# Patient Record
Sex: Male | Born: 1955 | Race: White | Hispanic: No | Marital: Married | Smoking: Never smoker
Health system: Southern US, Community
[De-identification: ages and names within clinical notes are randomized; demographics above are authoritative.]

## PROBLEM LIST (undated history)

## (undated) DIAGNOSIS — E785 Hyperlipidemia, unspecified: Secondary | ICD-10-CM

## (undated) DIAGNOSIS — I10 Essential (primary) hypertension: Secondary | ICD-10-CM

## (undated) HISTORY — DX: Hyperlipidemia, unspecified: E78.5

## (undated) HISTORY — PX: TONSILLECTOMY: SUR1361

## (undated) HISTORY — PX: KNEE SURGERY: SHX244

## (undated) HISTORY — DX: Essential (primary) hypertension: I10

## (undated) HISTORY — PX: ACROMIOPLASTY: SHX131

## (undated) HISTORY — PX: VASECTOMY: SHX75

---

## 2004-08-11 ENCOUNTER — Ambulatory Visit: Payer: Self-pay | Admitting: Family Medicine

## 2004-08-19 ENCOUNTER — Ambulatory Visit: Payer: Self-pay | Admitting: Internal Medicine

## 2004-08-25 ENCOUNTER — Ambulatory Visit: Payer: Self-pay | Admitting: Family Medicine

## 2005-03-17 ENCOUNTER — Ambulatory Visit: Payer: Self-pay | Admitting: Internal Medicine

## 2007-09-21 ENCOUNTER — Ambulatory Visit: Payer: Self-pay | Admitting: Family Medicine

## 2007-09-21 ENCOUNTER — Telehealth (INDEPENDENT_AMBULATORY_CARE_PROVIDER_SITE_OTHER): Payer: Self-pay | Admitting: Family Medicine

## 2007-09-21 DIAGNOSIS — E785 Hyperlipidemia, unspecified: Secondary | ICD-10-CM | POA: Insufficient documentation

## 2007-09-21 DIAGNOSIS — I10 Essential (primary) hypertension: Secondary | ICD-10-CM

## 2007-09-27 ENCOUNTER — Encounter: Payer: Self-pay | Admitting: Family Medicine

## 2007-09-27 LAB — CONVERTED CEMR LAB
ALT: 50 units/L (ref 0–53)
AST: 28 units/L (ref 0–37)
Albumin: 4.4 g/dL (ref 3.5–5.2)
Alkaline Phosphatase: 117 units/L (ref 39–117)
BUN: 11 mg/dL (ref 6–23)
CO2: 24 meq/L (ref 19–32)
Calcium: 9.3 mg/dL (ref 8.4–10.5)
Chloride: 103 meq/L (ref 96–112)
Cholesterol: 152 mg/dL (ref 0–200)
Creatinine, Ser: 0.93 mg/dL (ref 0.40–1.50)
Glucose, Bld: 88 mg/dL (ref 70–99)
HDL: 42 mg/dL (ref >39)
LDL Cholesterol: 88 mg/dL (ref 0–99)
PSA: 0.36 ng/mL (ref 0.10–4.00)
Potassium: 3.9 meq/L (ref 3.5–5.3)
Sodium: 139 meq/L (ref 135–145)
Total Bilirubin: 0.5 mg/dL (ref 0.3–1.2)
Total CHOL/HDL Ratio: 3.6
Total Protein: 6.9 g/dL (ref 6.0–8.3)
Triglycerides: 108 mg/dL (ref <150)
VLDL: 22 mg/dL (ref 0–40)

## 2007-11-03 ENCOUNTER — Encounter: Payer: Self-pay | Admitting: Family Medicine

## 2008-09-26 ENCOUNTER — Ambulatory Visit: Payer: Self-pay | Admitting: Family Medicine

## 2008-09-26 LAB — CONVERTED CEMR LAB
ALT: 63 units/L — ABNORMAL HIGH (ref 0–53)
AST: 35 units/L (ref 0–37)
Albumin: 4.2 g/dL (ref 3.5–5.2)
Alkaline Phosphatase: 121 units/L — ABNORMAL HIGH (ref 39–117)
BUN: 12 mg/dL (ref 6–23)
CO2: 24 meq/L (ref 19–32)
Calcium: 9.1 mg/dL (ref 8.4–10.5)
Chloride: 105 meq/L (ref 96–112)
Cholesterol: 165 mg/dL (ref 0–200)
Creatinine, Ser: 0.89 mg/dL (ref 0.40–1.50)
Glucose, Bld: 93 mg/dL (ref 70–99)
HDL: 33 mg/dL — ABNORMAL LOW (ref >39)
LDL Cholesterol: 105 mg/dL — ABNORMAL HIGH (ref 0–99)
PSA: 0.28 ng/mL (ref 0.10–4.00)
Potassium: 3.8 meq/L (ref 3.5–5.3)
Sodium: 140 meq/L (ref 135–145)
Total Bilirubin: 0.6 mg/dL (ref 0.3–1.2)
Total CHOL/HDL Ratio: 5
Total Protein: 7 g/dL (ref 6.0–8.3)
Triglycerides: 137 mg/dL (ref <150)
VLDL: 27 mg/dL (ref 0–40)

## 2008-09-28 ENCOUNTER — Encounter: Payer: Self-pay | Admitting: Family Medicine

## 2008-11-26 ENCOUNTER — Encounter: Payer: Self-pay | Admitting: Family Medicine

## 2009-03-27 ENCOUNTER — Ambulatory Visit: Payer: Self-pay | Admitting: Family Medicine

## 2009-04-03 ENCOUNTER — Telehealth (INDEPENDENT_AMBULATORY_CARE_PROVIDER_SITE_OTHER): Payer: Self-pay | Admitting: *Deleted

## 2009-04-23 ENCOUNTER — Telehealth (INDEPENDENT_AMBULATORY_CARE_PROVIDER_SITE_OTHER): Payer: Self-pay | Admitting: *Deleted

## 2009-05-03 ENCOUNTER — Encounter: Admission: RE | Admit: 2009-05-03 | Discharge: 2009-05-03 | Payer: Self-pay | Admitting: Family Medicine

## 2009-05-06 ENCOUNTER — Telehealth: Payer: Self-pay | Admitting: Family Medicine

## 2009-05-06 DIAGNOSIS — IMO0002 Reserved for concepts with insufficient information to code with codable children: Secondary | ICD-10-CM

## 2009-05-07 ENCOUNTER — Encounter (INDEPENDENT_AMBULATORY_CARE_PROVIDER_SITE_OTHER): Payer: Self-pay | Admitting: *Deleted

## 2009-05-13 ENCOUNTER — Encounter: Payer: Self-pay | Admitting: Family Medicine

## 2009-05-16 ENCOUNTER — Encounter: Payer: Self-pay | Admitting: Family Medicine

## 2009-05-24 ENCOUNTER — Ambulatory Visit (HOSPITAL_BASED_OUTPATIENT_CLINIC_OR_DEPARTMENT_OTHER): Admission: RE | Admit: 2009-05-24 | Discharge: 2009-05-24 | Payer: Self-pay | Admitting: Orthopedic Surgery

## 2009-07-04 ENCOUNTER — Encounter: Payer: Self-pay | Admitting: Family Medicine

## 2009-10-09 ENCOUNTER — Ambulatory Visit: Payer: Self-pay | Admitting: Family Medicine

## 2009-10-09 DIAGNOSIS — R5383 Other fatigue: Secondary | ICD-10-CM | POA: Insufficient documentation

## 2009-10-09 DIAGNOSIS — R6882 Decreased libido: Secondary | ICD-10-CM | POA: Insufficient documentation

## 2009-10-09 LAB — CONVERTED CEMR LAB
ALT: 33 units/L (ref 0–53)
AST: 22 units/L (ref 0–37)
Albumin: 4.3 g/dL (ref 3.5–5.2)
Alkaline Phosphatase: 127 units/L — ABNORMAL HIGH (ref 39–117)
BUN: 11 mg/dL (ref 6–23)
CO2: 23 meq/L (ref 19–32)
Calcium: 9.2 mg/dL (ref 8.4–10.5)
Chloride: 100 meq/L (ref 96–112)
Cholesterol: 156 mg/dL (ref 0–200)
Creatinine, Ser: 0.97 mg/dL (ref 0.40–1.50)
Glucose, Bld: 98 mg/dL (ref 70–99)
HCT: 43.5 % (ref 39.0–52.0)
HDL: 42 mg/dL (ref >39)
Hemoglobin: 14.7 g/dL (ref 13.0–17.0)
LDL Cholesterol: 95 mg/dL (ref 0–99)
MCHC: 33.8 g/dL (ref 30.0–36.0)
MCV: 82.2 fL (ref 78.0–100.0)
PSA: 0.35 ng/mL (ref 0.10–4.00)
Platelets: 311 10*3/uL (ref 150–400)
Potassium: 3.6 meq/L (ref 3.5–5.3)
RBC: 5.29 M/uL (ref 4.22–5.81)
RDW: 13.5 % (ref 11.5–15.5)
Sodium: 138 meq/L (ref 135–145)
TSH: 1.109 microintl units/mL (ref 0.350–4.500)
Testosterone: 295.41 ng/dL — ABNORMAL LOW (ref 350–890)
Total Bilirubin: 0.7 mg/dL (ref 0.3–1.2)
Total CHOL/HDL Ratio: 3.7
Total Protein: 6.6 g/dL (ref 6.0–8.3)
Triglycerides: 94 mg/dL (ref <150)
VLDL: 19 mg/dL (ref 0–40)
WBC: 12.2 10*3/uL — ABNORMAL HIGH (ref 4.0–10.5)

## 2009-10-14 ENCOUNTER — Ambulatory Visit: Payer: Self-pay | Admitting: Family Medicine

## 2009-10-14 DIAGNOSIS — S43429A Sprain of unspecified rotator cuff capsule, initial encounter: Secondary | ICD-10-CM

## 2009-11-11 ENCOUNTER — Telehealth: Payer: Self-pay | Admitting: Family Medicine

## 2009-12-17 ENCOUNTER — Telehealth: Payer: Self-pay | Admitting: Gastroenterology

## 2009-12-24 ENCOUNTER — Telehealth: Payer: Self-pay | Admitting: Family Medicine

## 2010-05-01 ENCOUNTER — Encounter: Payer: Self-pay | Admitting: *Deleted

## 2010-06-23 ENCOUNTER — Ambulatory Visit: Payer: Self-pay

## 2010-06-23 ENCOUNTER — Encounter: Payer: Self-pay | Admitting: Family Medicine

## 2010-06-26 ENCOUNTER — Telehealth: Payer: Self-pay | Admitting: Family Medicine

## 2010-07-03 ENCOUNTER — Telehealth: Payer: Self-pay | Admitting: *Deleted

## 2010-07-04 ENCOUNTER — Encounter
Admission: RE | Admit: 2010-07-04 | Discharge: 2010-07-04 | Payer: Self-pay | Source: Home / Self Care | Attending: Family Medicine | Admitting: Family Medicine

## 2010-07-07 DIAGNOSIS — S43439A Superior glenoid labrum lesion of unspecified shoulder, initial encounter: Secondary | ICD-10-CM

## 2010-07-08 ENCOUNTER — Encounter (INDEPENDENT_AMBULATORY_CARE_PROVIDER_SITE_OTHER): Payer: Self-pay | Admitting: *Deleted

## 2010-08-04 ENCOUNTER — Encounter: Payer: Self-pay | Admitting: Family Medicine

## 2010-08-13 LAB — BASIC METABOLIC PANEL
BUN: 10 mg/dL (ref 6–23)
CO2: 29 mEq/L (ref 19–32)
Calcium: 9.4 mg/dL (ref 8.4–10.5)
Chloride: 102 mEq/L (ref 96–112)
Creatinine, Ser: 0.99 mg/dL (ref 0.4–1.5)
GFR calc Af Amer: >60 mL/min (ref >60)
GFR calc non Af Amer: >60 mL/min (ref >60)
Glucose, Bld: 110 mg/dL — ABNORMAL HIGH (ref 70–99)
Potassium: 4.3 mEq/L (ref 3.5–5.1)
Sodium: 139 mEq/L (ref 135–145)

## 2010-08-15 ENCOUNTER — Ambulatory Visit
Admission: RE | Admit: 2010-08-15 | Discharge: 2010-08-15 | Payer: Self-pay | Source: Home / Self Care | Attending: Orthopedic Surgery | Admitting: Orthopedic Surgery

## 2010-08-17 ENCOUNTER — Encounter: Payer: Self-pay | Admitting: Family Medicine

## 2010-08-18 LAB — POCT HEMOGLOBIN-HEMACUE: Hemoglobin: 15.6 g/dL (ref 13.0–17.0)

## 2010-08-18 NOTE — Op Note (Signed)
NAME:  DONA, KLEMANN               ACCOUNT NO.:  [id number]  MEDICAL RECORD NO.:  [id number]          PATIENT TYPE:  AMB  LOCATION:  DSC                          FACILITY:  [facility]  PHYSICIAN:  Harvie Junior, M.D.   DATE OF BIRTH:  1956/01/08  DATE OF PROCEDURE:  08/15/2010 DATE OF DISCHARGE:                              OPERATIVE REPORT   PREOPERATIVE DIAGNOSIS:  Anterior labral tear with suspected impingement of the acromioclavicular joint arthritis.  POSTOPERATIVE DIAGNOSIS: 1. Anterior labral tear. 2. Loose body, glenohumeral joint. 3. Impingement. 4. Acromioclavicular joint arthritis.  SURGEON:  Harvie Junior, MD  ASSISTANT:  Marshia Ly, PA  PROCEDURES: 1. Debridement of loose body glenohumeral joint with debridement of     anterior labral tear. 2. Arthroscopic subacromial decompression from lateral and posterior     compartment. 3. Arthroscopic distal clavicle resection from an anterior compartment     over 20 mm.  ANESTHESIA:  General.  BRIEF HISTORY:  Mr. Raju is a 55 year old male with long history of having fallen on the shoulder.  He has been having catching and pain in the shoulder since that time.  He had been treated by Dr. Denny Levy for a long period of time with activity modification, injection therapy. Because of the continuing complaints of pain, he was ultimately taken to the operating room for evaluation under anesthesia and arthroscopy. Preoperatively, I did not feel that he had an unstable shoulder just compared to the opposite side and it really did not seem like he was apprehensive, it did seem like he has more pain.  PROCEDURE:  The patient was taken to the operating room.  After adequate anesthesia was obtained with general anesthetic, the patient was placed supine on the operating room table.  I then spent some time examining both shoulders.  He did have a slight anterior and posterior translation on the right side, but this was  completely symmetric to the right side. I could not dislocate him despite significant attempts to try that.  At this point, the patient was placed into the beach-chair position.  All bony prominences were well padded and then the right shoulder was prepped and draped in the usual sterile fashion.  Following this, routine arthroscopic examination did reveal there was an obvious bony loose body right in the anterior aspect of the glenoid.  This was debrided.  At this point, you could see that the glenoid sort of came over and there was a little bit of a deficiency of the glenoid anteriorly, and this was kind of fractured back a little bit; and we were able to probe this hardened area.  As you follow it down around inferiorly, you could see where this was a standard area of the inferior portion of the glenoid, and at that point, we took a probe and spent some time feeling the labrum and the capsule as it comes into the labrum and certainly became convinced that the labrum was attached to this folded back piece, although there was some hypertrophic labrum and synovium in the front area.  So, at this point, we spent a fair amount of  time debriding the inferior rim, debriding the labrum which was caught into the joint.  We went ahead and put the camera in the front and looked into back.  There was a little bit of posterior labral wear, but not bad.  Rotator cuff on the undersurface looked good.  Biceps tendon looked good.  At this point, we went out of the glenohumeral joint and clearly while we were in the glenohumeral joint, we watched the shoulder with rotation to see if there is any tendency towards subluxation and you could see the middle glenohumeral ligament tightened up and felt that it was working as an anterior stop to translation. Once this was completed, attention was turned out of the glenohumeral joint into the subacromial space and anterolateral acromioplasty was performed from  the lateral and posterior compartment.  Distal clavicle resection was performed over 20 mm of the anterior compartment and the subtotal bursectomy was performed in the subacromial space.  At this point, shoulder was thoroughly irrigated and suction dried, and the arthroscopic portals were closed with a bandage.  Sterile compression dressing was applied.  The patient was taken to the recovery room and was noted to be in satisfactory condition.  Estimated blood loss for this procedure was none.     Harvie Junior, M.D.     Ranae Plumber  D:  08/15/2010  T:  08/16/2010  Job:  [id number]  Electronically Signed by Jodi Geralds M.D. on 08/18/2010 11:01:00 AM

## 2010-08-26 NOTE — Assessment & Plan Note (Signed)
Summary: shoulder & knee,df   Vital Signs:  Patient profile:   55 year old male BP sitting:   115 / 79  Vitals Entered By: Lillia Pauls CMA (June 23, 2010 3:27 PM)  History of Present Illness: 1) continued right shoulder problems--(see notes about his fall inprevious visit with me). It hurts sometimes more than others, achiing but also a catching sharp pain with certainmotions. No hand numbness, no weakness, just some actions he cannot do without pain   2) also f/u knee (left0 . had done well after scope until he visited his brother in law--had to climb steep fligt of stairs multiple times a day for several days and his knee swelled and became painful. he iced it and it is better today.  3) Thinks the androgel is making a little difference in energy and libido but does not like the texture of it--wants to try anything else---(not shot though.)  Current Medications (verified): 1)  Triamterene-Hctz 37.5-25 Mg  Tabs (Triamterene-Hctz) .Marland Kitchen.. 1 By Mouth Once Daily 2)  Lipitor 10 Mg  Tabs (Atorvastatin Calcium) .Marland Kitchen.. 1 By Mouth Once Daily 3)  Aspirin 81 Mg  Tbec (Aspirin) .... One By Mouth Every Day 4)  Fluticasone Propionate 50 Mcg/act Susp (Fluticasone Propionate) .... 2 Sprays Each Nostril Qd 5)  Androderm 5 Mg/24hr Pt24 (Testosterone) .... Apply One As Directed Qhs  Allergies: No Known Drug Allergies  Review of Systems  The patient denies anorexia, fever, weight loss, and weight gain.    Physical Exam  Msk:  RIGHT shoulder pain with supraspinatus testing but intact strength. Positive O brien's test. IR/ER full strength. distally neurovascularly intact  Left knee--no tenderness, no swelling. ligamentouslyintact   Impression & Recommendations:  Problem # 1:  ROTATOR CUFF SPRAIN AND STRAIN (ICD-840.4)  Orders: MRI (MRI) As he did not improve with HEP or injection, and with his signs of labrtal tear, Will get MR arthrogram  Problem # 2:  MEDIAL MENISCUS TEAR, LEFT  (ICD-836.0) resolved issue  Problem # 3:  LIBIDO, DECREASED (ICD-799.81) change to patch  Complete Medication List: 1)  Triamterene-hctz 37.5-25 Mg Tabs (Triamterene-hctz) .Marland Kitchen.. 1 by mouth once daily 2)  Lipitor 10 Mg Tabs (Atorvastatin calcium) .Marland Kitchen.. 1 by mouth once daily 3)  Aspirin 81 Mg Tbec (Aspirin) .... One by mouth every day 4)  Fluticasone Propionate 50 Mcg/act Susp (Fluticasone propionate) .... 2 sprays each nostril qd 5)  Androderm 5 Mg/24hr Pt24 (Testosterone) .... Apply one as directed qhs  Patient Instructions: 1)  MR ARTHROGRAM IS SCHD FOR FRI, 12.9.11 AT FRI AT 3:15PM. [facility]; ADDRESS IS [address]; [phone] Prescriptions: ANDRODERM 5 MG/24HR PT24 (TESTOSTERONE) apply one as directed qhs  #30 x 5   Entered by:   Lillia Pauls CMA   Authorized by:   Denny Levy MD   Signed by:   Lillia Pauls CMA on 06/23/2010   Method used:   Telephoned to ...       [facility] [id number] (retail)       [address]       [place], [place]  [address]       Ph: [phone] or [phone]       Fax: [phone]   RxID:   [id number] ANDRODERM 5 MG/24HR PT24 (TESTOSTERONE) apply one as directed qhs  #30 x 12   Entered and Authorized by:   Denny Levy MD   Signed by:   Denny Levy MD on  06/23/2010   Method used:   Printed then faxed to ...       [id number] (retail)       [address]       [place], [place]  [address]       Ph: [phone] or [phone]       Fax: [phone]   RxID:   [id number]    Orders Added: 1)  MRI [MRI] 2)  Est. Patient Level IV [34742]

## 2010-08-26 NOTE — Progress Notes (Signed)
Summary: Rx/new pharmacy  Phone Note Refill Request   Refills Requested: Medication #1:  ANDROGEL 25 MG/2.5GM GEL apply as directed qd. call into [facility]  Initial call taken by: Knox Royalty,  July 03, 2010 8:52 AM    Prescriptions: ANDROGEL 25 MG/2.5GM GEL (TESTOSTERONE) apply as directed qd  #30 x 12   Entered and Authorized by:   Denny Levy MD   Signed by:   Denny Levy MD on 07/04/2010   Method used:   Telephoned to ...       [facility]* (retail)       [facility] [place], [place]  [address]       Ph: [phone]       Fax: [phone]   RxID:   [id number]   DEAR WHITE TEAM can u call this in? Thanks!  Denny Levy MD  July 04, 2010 9:05 AM  this can only have a refill of 5 b/c of it being a controlled substance??? pt notified.Loralee Pacas CMA  July 04, 2010 10:16 AM

## 2010-08-26 NOTE — Progress Notes (Signed)
Summary: phn msg  Phone Note Call from Patient Call back at [phone]   Caller: Patient Summary of Call: Pt calling to let Dr. Jennette Kettle know that he is still having pain in his right shoulder.  Better but still grabbing at times. Initial call taken by: Clydell Hakim,  November 11, 2009 9:40 AM  Follow-up for Phone Call        to pcp Follow-up by: Gladstone Pih,  November 11, 2009 10:38 AM  Additional Follow-up for Phone Call Additional follow up Details #1::        left messg I will call him back later today  Denny Levy MD  November 11, 2009 11:54 AM     Additional Follow-up for Phone Call Additional follow up Details #2::    I spoke w [facility] re shoulder: It is much better (0% of time but has occasional "catching" that is really painful. reviewing mechanism of his injury i think a labral tear is a distinct possiblilty. We discussed options--he wants to hold off for right now. Next step would be MR Arthrogram

## 2010-08-26 NOTE — Assessment & Plan Note (Signed)
Summary: cpe,df   Vital Signs:  Patient profile:   55 year old male Height:      72 inches Weight:      214.3 pounds BMI:     29.17 Temp:     97.6 degrees F oral Pulse rate:   96 / minute BP sitting:   116 / 82  (left arm) Cuff size:   regular  Vitals Entered By: Gladstone Pih (October 09, 2009 8:40 AM)  CC: CPE, C/o right shoulder pain from fall X3 weeks ago Is Patient Diabetic? No Pain Assessment Patient in pain? no        CC:  CPE and C/o right shoulder pain from fall X3 weeks ago.  History of Present Illness: here for cpe Follow up hypertension. Taking medicines regularly with no problems. Not having any any headaches or chest pains. f/u knee pain--had surgery and is doing really well  had a fall last week and jammed his shoulder having trouble lifting it above shoulder height  fatigue more than usual last few months and has lost his sex drive--no erectile dysfunction.   Habits & Providers  Alcohol-Tobacco-Diet     Tobacco Status: never  Current Medications (verified): 1)  Triamterene-Hctz 37.5-25 Mg  Tabs (Triamterene-Hctz) .Marland Kitchen.. 1 By Mouth Once Daily 2)  Lipitor 10 Mg  Tabs (Atorvastatin Calcium) .Marland Kitchen.. 1 By Mouth Once Daily 3)  Aspirin 81 Mg  Tbec (Aspirin) .... One By Mouth Every Day 4)  Fluticasone Propionate 50 Mcg/act Susp (Fluticasone Propionate) .... 2 Sprays Each Nostril Qd  Allergies (verified): No Known Drug Allergies  Review of Systems  The patient denies anorexia, fever, weight loss, weight gain, chest pain, syncope, dyspnea on exertion, peripheral edema, prolonged cough, headaches, hemoptysis, abdominal pain, severe indigestion/heartburn, hematuria, genital sores, and difficulty walking.    Physical Exam  General:  alert, well-developed, and well-nourished.   Eyes:  vision grossly intact, pupils equal, pupils round, and pupils reactive to light.   Ears:  R ear normal and L ear normal.   Mouth:  good dentition and no gingival abnormalities.     Neck:  supple, full ROM, no masses, no thyromegaly, and no JVD.   Breasts:  no gynecomastia.   Lungs:  normal respiratory effort and normal breath sounds.   Heart:  normal rate, regular rhythm, and no murmur.   Abdomen:  soft, non-tender, no distention, and no masses.   Genitalia:  circumcised.   Prostate:  no gland enlargement, no nodules, and no asymmetry.   Msk:  normal ROM except for right shoulder which he cannot ABduct above shoulder level .  Extremities:  no edema Neurologic:  alert & oriented X3, cranial nerves II-XII intact, strength normal in all extremities, gait normal, and DTRs symmetrical and normal.   Skin:  no worrisome lesions on complete skin exam Psych:  Oriented X3, memory intact for recent and remote, normally interactive, good eye contact, not anxious appearing, and not depressed appearing.     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 1 by mouth once daily 2)  Lipitor 10  Mg Tabs (Atorvastatin calcium) .Marland Kitchen.. 1 by mouth once daily 3)  Aspirin 81 Mg Tbec (Aspirin) .... One by mouth every day 4)  Fluticasone Propionate 50 Mcg/act Susp (Fluticasone propionate) .... 2 sprays each nostril qd Lipid-[facility] [phone]) Comp Met-[facility] [phone]) CBC-[facility] ([address]) TSH-[facility] [phone]) PSA-[facility] [phone]) Testosterone-[facility] [phone]) [facility] - Est  40-64 yrs [phone])  Prevention & Chronic Care Immunizations   Influenza vaccine: Not documented    Tetanus booster: 09/21/2007: given   Tetanus booster due:  09/20/2017    Pneumococcal vaccine: Not documented  Colorectal Screening   Hemoccult: Not documented   Hemoccult due: Not Indicated    Colonoscopy: normal  (10/27/2007)   Colonoscopy due: 10/26/2012  Other Screening   PSA: 0.28  (09/26/2008)   PSA ordered.   PSA due due: 09/20/2008   Smoking status: never  (10/09/2009)  Lipids   Total Cholesterol: 165  (09/26/2008)   LDL: 105  (09/26/2008)   LDL Direct: Not documented   HDL: 33  (09/26/2008)   Triglycerides: 137  (09/26/2008)    SGOT (AST): 35  (09/26/2008)   SGPT (ALT): 63  (09/26/2008) CMP ordered    Alkaline phosphatase: 121  (09/26/2008)   Total bilirubin: 0.6  (09/26/2008)    Lipid flowsheet reviewed?: Yes   Progress toward LDL goal: At goal  Hypertension   Last Blood Pressure: 116 / 82  (10/09/2009)   Serum creatinine: 0.89  (09/26/2008)   Serum potassium 3.8  (09/26/2008) CMP ordered     Hypertension flowsheet reviewed?: Yes   Progress toward BP goal: At goal  Self-Management Support :    Hypertension self-management support: Not documented    Lipid self-management support: Not documented    Impression & Recommendations:  Orders: [facility] - Est  40-64 yrs ([address])   Orders: CBC-[facility] (93716) TSH-[facility] [phone]) PSA-[facility] [phone]) Testosterone-[facility] [phone]) [facility] - Est  40-64 yrs ([address])   Complete Medication List: 1)  Triamterene-hctz 37.5-25 Mg Tabs (Triamterene-hctz) .Marland Kitchen.. 1 by mouth once daily 2)  Lipitor 10 Mg Tabs (Atorvastatin calcium) .Marland Kitchen.. 1 by mouth once daily 3)  Aspirin 81 Mg Tbec (Aspirin) .... One by mouth every day 4)  Fluticasone Propionate 50 Mcg/act Susp (Fluticasone propionate) .... 2 sprays each nostril qd  Other Orders: Lipid-[facility] ([id number]) Comp Met-[facility] ([id number])

## 2010-08-26 NOTE — Letter (Signed)
Summary: [employer]  [employer]   Imported By: Marily Memos 06/24/2010 13:32:15  _____________________________________________________________________  External Attachment:    Type:   Image     Comment:   External Document

## 2010-08-26 NOTE — Assessment & Plan Note (Signed)
Summary: 3:00 appt/shoulder pain/per neal/eo   Vital Signs:  Patient profile:   55 year old male Height:      73 inches Weight:      209 pounds BP sitting:   125 / 88  Vitals Entered By: Lillia Pauls CMA (October 14, 2009 3:01 PM)  History of Present Illness: r shoulder pain after fall onto front sideof body with elbow flexed, palm very close to body. Occurred in parking lot 3 1/2 weeks ago. He stumbled over low curb.  immediate shoulder pain, had difficulty driving home taht day. Has gotten some better, but still a lot of pain with any movement above shoulder height, especially reaching overhead. pain is 4-7/10, is keeping him awake at night some. No numbness or tingling in fingers, no hand weakness.  No prior shoulder injury or surgeruy. Right hand dominant.  Allergies (verified): No Known Drug Allergies  Review of Systems       see hpi  Physical Exam  General:  alert and well-developed.   Msk:  R shoulder; AROm with supraspinatus testing reproduces pain. Strength seems intact altho limited by pain.IR / ER intact. Pain with placing had on lumbar area and can barely get either side to center of spine. Lift off test is normal however.  [place]: rotator cuff muscles appear intact. there is some noted fluid in the subacromial bursa. no impingement noted on dynamic testing. Bicep tendon appears normal and there is nossurrounding edema.   Impression & Recommendations:  Problem # 1:  ROTATOR CUFF SPRAIN AND STRAIN (ICD-840.4)  I see no tear in cuff muscles. I could not see the labrum well at all. We decided to inject subacromial bursa, gave him HEP for next two weeks and he will follow up by phone. Therwe is still potential for labral tear given mechanism of injury, so if not improving as expected, needs re-eval.  Orders: [place] LIMITED ([id number])  Complete Medication List: 1)  Triamterene-hctz 37.5-25 Mg Tabs (Triamterene-hctz) .Marland Kitchen.. 1 by mouth once daily 2)  Lipitor 10 Mg Tabs  (Atorvastatin calcium) .Marland Kitchen.. 1 by mouth once daily 3)  Aspirin 81 Mg Tbec (Aspirin) .... One by mouth every day 4)  Fluticasone Propionate 50 Mcg/act Susp (Fluticasone propionate) .... 2 sprays each nostril qd 5)  Androgel 50 Mg/5gm Gel (Testosterone) .... Apply contents of entire packet to arms or shoulders once a day wash hands after Prescriptions: ANDROGEL 50 MG/5GM GEL (TESTOSTERONE) apply contents of entire packet to arms or shoulders once a day wash hands after  #30 x 12   Entered and Authorized by:   Denny Levy MD   Signed by:   Denny Levy MD on 10/14/2009   Method used:   Print then Give to Patient   RxID:   [id number]   Appended Document: 3:00 appt/shoulder pain/per neal/eo    Clinical Lists Changes  Orders: Added new Service order of Joint Aspirate / Injection, Large (20610) - Signed Added new Service order of Kenalog 10 mg inj ([id number]) - Signed       Complete Medication List: 1)  Triamterene-hctz 37.5-25 Mg Tabs (Triamterene-hctz) .Marland Kitchen.. 1 by mouth once daily 2)  Lipitor 10 Mg Tabs (Atorvastatin calcium) .Marland Kitchen.. 1 by mouth once daily 3)  Aspirin 81 Mg Tbec (Aspirin) .... One by mouth every day 4)  Fluticasone Propionate 50 Mcg/act Susp (Fluticasone propionate) .... 2 sprays each nostril qd 5)  Androgel 50 Mg/5gm Gel (Testosterone) .... Apply contents of entire packet to arms or shoulders once a day wash  hands after

## 2010-08-26 NOTE — Progress Notes (Signed)
Summary: fyi  Phone Note Call from Patient   Caller: Patient Summary of Call: can't use the patch b/c it is cost prohibitive - will continue to [place] the Androgel. Initial call taken by: De Nurse,  June 26, 2010 11:24 AM    New/Updated Medications: ANDROGEL 25 MG/2.5GM GEL (TESTOSTERONE) apply as directed qd Prescriptions: ANDROGEL 25 MG/2.5GM GEL (TESTOSTERONE) apply as directed qd  #30 x 12   Entered and Authorized by:   Denny Levy MD   Signed by:   Denny Levy MD on 06/30/2010   Method used:   Historical   RxID:   [id number]

## 2010-08-26 NOTE — Miscellaneous (Signed)
Summary: flu vaccine received  Clinical Lists Changes received notification from [facility]  that patient received flu vaccine 05/01/2010. Theresia Lo RN  May 01, 2010 11:04 AM  Observations: Added new observation of FLU VAX: Historical (05/01/2010 11:03)      Influenza Immunization History:    Influenza # 1:  Historical (05/01/2010)

## 2010-08-26 NOTE — Progress Notes (Signed)
Summary: Schedule Colonoscopy-CHANGED TO [facility]  Phone Note Outgoing Call   Call placed by: Lamona Curl CMA Duncan Dull),  Dec 17, 2009 3:23 PM Call placed to: Patient Summary of Call: Patient is overdue for colonoscopy. His last colonoscopy was in 2000 and showed hemorrhoids. Patient is now due for colonoscopy due to age. I have spoken to patient and he has changed to [facility]. Initial call taken by: Lamona Curl CMA (AAMA),  Dec 17, 2009 3:24 PM

## 2010-08-26 NOTE — Progress Notes (Signed)
Summary: refill  Phone Note Refill Request Call back at Home Phone [phone] Call back at [phone] Message from:  Patient  Refills Requested: Medication #1:  LIPITOR 10 MG  TABS 1 by mouth once daily [facility] [phone]  Initial call taken by: De Nurse,  Dec 24, 2009 8:41 AM    Prescriptions: LIPITOR 10 MG  TABS (ATORVASTATIN CALCIUM) 1 by mouth once daily  #90 x 3   Entered and Authorized by:   Denny Levy MD   Signed by:   Denny Levy MD on 12/24/2009   Method used:   Electronically to        [facility]* (retail)       [facility] [place], [place]  [address]       Ph: [phone]       Fax: [phone]   RxID:   [id number]

## 2010-08-26 NOTE — Miscellaneous (Signed)
  Clinical Lists Changes  Medications: Added new medication of ANDROGEL 50 MG/5GM GEL (TESTOSTERONE) apply contents of entire packet to arms or shoulders once a day wash hands after - Signed Rx of ANDROGEL 50 MG/5GM GEL (TESTOSTERONE) apply contents of entire packet to arms or shoulders once a day wash hands after;  #30 x 12;  Signed;  Entered by: Denny Levy MD;  Authorized by: Denny Levy MD;  Method used: Historical Orders: Added new Test order of CBC-[facility] ([id number]) - Signed Added new Test order of Miscellaneous Lab Charge-[facility] [phone]) - Signed    Prescriptions: ANDROGEL 50 MG/5GM GEL (TESTOSTERONE) apply contents of entire packet to arms or shoulders once a day wash hands after  #30 x 12   Entered and Authorized by:   Denny Levy MD   Signed by:   Denny Levy MD on 10/14/2009   Method used:   Historical   RxID:   [id number]    Complete Medication List: 1)  Triamterene-hctz 37.5-25 Mg Tabs (Triamterene-hctz) .Marland Kitchen.. 1 by mouth once daily 2)  Lipitor 10 Mg Tabs (Atorvastatin calcium) .Marland Kitchen.. 1 by mouth once daily 3)  Aspirin 81 Mg Tbec (Aspirin) .... One by mouth every day 4)  Fluticasone Propionate 50 Mcg/act Susp (Fluticasone propionate) .... 2 sprays each nostril qd 5)  Androgel 50 Mg/5gm Gel (Testosterone) .... Apply contents of entire packet to arms or shoulders once a day wash hands after

## 2010-08-28 NOTE — Consult Note (Signed)
Summary: [facility]  [facility]   Imported By: Knox Royalty 08/21/2010 11:52:31  _____________________________________________________________________  External Attachment:    Type:   Image     Comment:   External Document

## 2010-08-28 NOTE — Miscellaneous (Signed)
Summary: ortho appt for shoulder  appt with dr. Luiz Blare on jan 9th 2012 at 9:30am [address] [place], [place]  [phone]

## 2010-09-04 ENCOUNTER — Encounter: Payer: Self-pay | Admitting: *Deleted

## 2010-10-15 ENCOUNTER — Encounter: Payer: Self-pay | Admitting: Family Medicine

## 2010-10-15 ENCOUNTER — Ambulatory Visit (INDEPENDENT_AMBULATORY_CARE_PROVIDER_SITE_OTHER): Payer: PRIVATE HEALTH INSURANCE | Admitting: Family Medicine

## 2010-10-15 DIAGNOSIS — E291 Testicular hypofunction: Secondary | ICD-10-CM

## 2010-10-15 DIAGNOSIS — Z0000 Encounter for general adult medical examination without abnormal findings: Secondary | ICD-10-CM | POA: Insufficient documentation

## 2010-10-15 DIAGNOSIS — I10 Essential (primary) hypertension: Secondary | ICD-10-CM

## 2010-10-15 DIAGNOSIS — E349 Endocrine disorder, unspecified: Secondary | ICD-10-CM

## 2010-10-15 LAB — LIPID PANEL
Cholesterol: 153 mg/dL (ref 0–200)
HDL: 44 mg/dL (ref >39)
LDL Cholesterol: 92 mg/dL (ref 0–99)
Total CHOL/HDL Ratio: 3.5 Ratio
Triglycerides: 87 mg/dL (ref <150)
VLDL: 17 mg/dL (ref 0–40)

## 2010-10-15 LAB — TESTOSTERONE: Testosterone: 236.53 ng/dL — ABNORMAL LOW (ref 250–890)

## 2010-10-15 MED ORDER — TRIAMTERENE-HCTZ 37.5-25 MG PO TABS: 1.0000 | ORAL_TABLET | Freq: Every day | ORAL | Status: DC

## 2010-10-15 MED ORDER — ATORVASTATIN CALCIUM 10 MG PO TABS: 10.0000 mg | ORAL_TABLET | Freq: Every day | ORAL | Status: DC

## 2010-10-15 NOTE — Progress Notes (Signed)
  Subjective:    Patient ID: Shawn Fitzpatrick, male    DOB: 1956-07-22, 55 y.o.   MRN: [id number]  HPI  Here for well visit Taking BP and chol meds regularly without problem. Has been off his androgel since before his surgery--did notice that it helped so heplans to go backon it. Had his shoulder surgery and is doing much better Review of Systems Complete 14 point ROS is negative for any pertinent positives other than mild right shoulder pain as above    Objective:   Physical Exam  Constitutional: He appears well-developed and well-nourished.  HENT:  Right Ear: External ear normal.  Left Ear: External ear normal.  Eyes: Conjunctivae are normal. Pupils are equal, round, and reactive to light.  Neck: Normal range of motion. Neck supple. No thyromegaly present.  Cardiovascular: Normal rate, regular rhythm and normal heart sounds.   Pulmonary/Chest: Effort normal and breath sounds normal.  Abdominal: Soft. Bowel sounds are normal. He exhibits no mass. There is no tenderness.  Genitourinary: Rectum normal, prostate normal and penis normal.  Musculoskeletal:       FROm all joints except righ shoulder still some pain with overhead extension   Neurological: He is alert. He has normal reflexes.  Skin: Skin is warm and dry.       Complete skin exam negative for worrisome lesions  Psychiatric: He has a normal mood and affect. His behavior is normal. Judgment and thought content normal.          Assessment & Plan:

## 2010-10-15 NOTE — Assessment & Plan Note (Signed)
Labs Encouraged more exercise

## 2010-10-15 NOTE — Patient Instructions (Signed)
Great to see you. Tell Shawn Fitzpatrick to come on back and see me if her foot is not getting better--also sorry to hear about her brother. YOU look wonderful--BP excellent. I will send you a note about your labs. Your Td and colonoscopy are in date. I will plan on seeing you in a year but call sooner if something comes up.

## 2010-10-16 LAB — COMPLETE METABOLIC PANEL WITH GFR
ALT: 46 U/L (ref 0–53)
AST: 25 U/L (ref 0–37)
Albumin: 4.6 g/dL (ref 3.5–5.2)
Alkaline Phosphatase: 128 U/L — ABNORMAL HIGH (ref 39–117)
BUN: 16 mg/dL (ref 6–23)
CO2: 28 mEq/L (ref 19–32)
Calcium: 10.2 mg/dL (ref 8.4–10.5)
Chloride: 100 mEq/L (ref 96–112)
Creat: 0.92 mg/dL (ref 0.40–1.50)
GFR, Est African American: >60 mL/min (ref >60)
GFR, Est Non African American: >60 mL/min (ref >60)
Glucose, Bld: 86 mg/dL (ref 70–99)
Potassium: 3.8 mEq/L (ref 3.5–5.3)
Sodium: 139 mEq/L (ref 135–145)
Total Bilirubin: 1 mg/dL (ref 0.3–1.2)
Total Protein: 7.1 g/dL (ref 6.0–8.3)

## 2010-10-30 LAB — BASIC METABOLIC PANEL
BUN: 7 mg/dL (ref 6–23)
CO2: 29 mEq/L (ref 19–32)
Calcium: 9.3 mg/dL (ref 8.4–10.5)
Chloride: 102 mEq/L (ref 96–112)
Creatinine, Ser: 0.96 mg/dL (ref 0.4–1.5)
GFR calc Af Amer: >60 mL/min (ref >60)
GFR calc non Af Amer: >60 mL/min (ref >60)
Glucose, Bld: 99 mg/dL (ref 70–99)
Potassium: 3.9 mEq/L (ref 3.5–5.1)
Sodium: 140 mEq/L (ref 135–145)

## 2010-10-30 LAB — POCT HEMOGLOBIN-HEMACUE: Hemoglobin: 15.2 g/dL (ref 13.0–17.0)

## 2010-11-10 ENCOUNTER — Telehealth: Payer: Self-pay | Admitting: *Deleted

## 2010-11-10 NOTE — Telephone Encounter (Signed)
Shawn Fitzpatrick from [facility]  calls stating she has received an RX from Dr. Jennette Kettle for maxizide tabs and patient has been getting  Dyazide capsules.  Same ingredients 37.5/25 mg but in capsule instead of tab. Called Dr. Jennette Kettle and she advises ok to give what he has been getting,  the Dyazide.

## 2010-11-20 ENCOUNTER — Telehealth: Payer: Self-pay | Admitting: Family Medicine

## 2010-11-20 NOTE — Telephone Encounter (Signed)
States that he wants to stop using Androgel b/c it makes him depressed and not feel good.  After googling this, he has determined it could be from the gel.  Wants to know if he should cut back on it or just stop using it. Please advise

## 2010-11-24 NOTE — Telephone Encounter (Signed)
Dear [facility] Team Let's see if using it twice a week or three times a week makes a difference. THANKS! Denny Levy

## 2010-11-24 NOTE — Telephone Encounter (Signed)
Told pt what Dr. Jennette Kettle suggested regarding meds and he agreed and if he continues to have anymore issues he will call to inform us.Laureen Ochs, Viann Shove

## 2010-12-16 ENCOUNTER — Encounter: Payer: Self-pay | Admitting: Family Medicine

## 2011-07-31 ENCOUNTER — Ambulatory Visit (HOSPITAL_COMMUNITY)
Admission: RE | Admit: 2011-07-31 | Discharge: 2011-07-31 | Disposition: A | Discharge status: Home / Self Care | Payer: BC Managed Care – PPO | Source: Ambulatory Visit | Attending: Family Medicine | Admitting: Family Medicine

## 2011-07-31 ENCOUNTER — Ambulatory Visit (INDEPENDENT_AMBULATORY_CARE_PROVIDER_SITE_OTHER): Payer: BC Managed Care – PPO | Admitting: Family Medicine

## 2011-07-31 ENCOUNTER — Encounter: Payer: Self-pay | Admitting: Family Medicine

## 2011-07-31 VITALS — BP 163/103 | HR 113 | Temp 98.7°F | Ht 73.0 in | Wt 214.0 lb

## 2011-07-31 DIAGNOSIS — R079 Chest pain, unspecified: Secondary | ICD-10-CM

## 2011-07-31 DIAGNOSIS — K2970 Gastritis, unspecified, without bleeding: Secondary | ICD-10-CM

## 2011-07-31 DIAGNOSIS — R9431 Abnormal electrocardiogram [ECG] [EKG]: Secondary | ICD-10-CM | POA: Insufficient documentation

## 2011-07-31 DIAGNOSIS — K2990 Gastroduodenitis, unspecified, without bleeding: Secondary | ICD-10-CM

## 2011-07-31 MED ORDER — OMEPRAZOLE 20 MG PO CPDR: 20.0000 mg | DELAYED_RELEASE_CAPSULE | Freq: Every day | ORAL | Status: DC

## 2011-07-31 MED ORDER — GI COCKTAIL ~~LOC~~
30.0000 mL | Freq: Once | ORAL | Status: AC
  Administered 2011-07-31: 30 mL via ORAL

## 2011-07-31 NOTE — Progress Notes (Signed)
  Subjective:    Patient ID: Shawn Fitzpatrick, male    DOB: 06-29-1956, 56 y.o.   MRN: [id number]  HPI  1. Shoulder pain. Patient began having epigastric pain radiating to bilateral shoulders two days ago. Accompanied by sensation of needing to belch, but cannot let air out. Initially thought just GI related and has been taking pepcid, not helping. Has been eating large meals over the holidays, but unable to tolerate more than a plain lettuce sandwich past 2 days. He presents for work-in appointment because pepcid bottle told him to present to MD if shoulder or chest pain and now is worried about alternate etiologies. Denies any nausea, emesis, diarrhea, constipation, diaphoresis, chest pain, edema. Does feel like his inferior ribs hurt with deep breathing and his heart beats faster with exertion.  Takes daily aspirin, unsure if enteric coated.  Review of Systems Treated for sinus infection as UC yesterday, otherwise negative. No smoking history or heart disease.    Objective:   Physical Exam  Vitals reviewed. Constitutional: He is oriented to person, place, and time. He appears well-developed and well-nourished. No distress.  HENT:  Head: Normocephalic and atraumatic.  Mouth/Throat: Oropharynx is clear and moist. No oropharyngeal exudate.  Eyes: EOM are normal. Pupils are equal, round, and reactive to light.  Cardiovascular: Normal rate, regular rhythm, normal heart sounds and intact distal pulses.  Exam reveals no gallop.   No murmur heard. Pulmonary/Chest: Effort normal and breath sounds normal. No respiratory distress. He has no wheezes. He has no rales. He exhibits no tenderness.  Abdominal: Soft. Bowel sounds are normal. He exhibits no distension. There is tenderness. There is no rebound and no guarding.       Mildly TTP in epigastric region. No RUQ pain.  Musculoskeletal: He exhibits no edema.  Neurological: He is alert and oriented to person, place, and time. No cranial nerve  deficit. Coordination normal.  Skin: He is not diaphoretic.  Psychiatric:       Mildly anxious    EKG: Sinus tachy Rate 101, biphasic TW in V4,5. No evidence of ischemia or arrhythmia.       Assessment & Plan:

## 2011-07-31 NOTE — Assessment & Plan Note (Addendum)
Abdominal pain improved with GI cocktail in office today. No signs of cardiac etiology on EKG. Advised patient to continue pepcid and start omeprazole for a short course of dual therapy. Maalox prn for short term relief. Change daily aspirin to 81mg  enteric coated. F/u with PCP in 2 weeks for reassessment of pain and BP check noted to be mildly elevated most likely due to pain.

## 2011-07-31 NOTE — Patient Instructions (Signed)
Your EKG looks normal, most likely pain is gastritis/indigestion. Continue taking pepcid up to 40mg  daily. Start omeprazole also. May try mylanta or maalox for quicker symptom relief. Return to Dr Jennette Kettle in next 2 weeks for follow up.  Gastritis Gastritis is an irritation of the stomach. This is often caused by medications, but can be from anything that bothers the stomach. Other stomach irritants are:  Alcohol.   Caffeine.   Nicotine.   Spicy or acid foods.   Medications for pain and arthritis. Aspirin and other anti-inflammatory medicines such as ibuprofen (Advil), naproxen (Aleve), and ketoprofen (Orudis) can be highly irritating.   Emotional distress.  Symptoms of gastritis may include:  Abdominal pain.   Indigestion.   Nausea and or vomiting.   Bleeding.  Some patients with chronic gastritis and ulcers have been infected by a germ. They may need special testing. Medications which kill germs can be used to cure this condition. Treatment includes avoiding the substances mentioned above that are known to cause stomach trouble. Medications used to treat gastritis can include:  Antacids.   Medicines to control vomiting.   Acid blocking medicines.  Symptoms of gastritis usually improve within 2-3 days of starting treatment. Call your caregiver if you are not better in a few days. SEEK MEDICAL CARE IF:   You have increased stomach or chest pain.   You vomit blood.   You faint or feel lightheaded.   You cannot keep fluids down.   You pass bloody or black stools.   You develop severe back pain.  MAKE SURE YOU:   Understand these instructions.   Will watch your condition.   Will get help right away if you are not doing well or get worse.  Document Released: 07/13/2005 Document Revised: 03/25/2011 Document Reviewed: 12/29/2006 [facility] Patient Information 2012 [place], [place].

## 2011-08-06 ENCOUNTER — Telehealth: Payer: Self-pay | Admitting: Family Medicine

## 2011-08-06 NOTE — Telephone Encounter (Signed)
Spoke with patient after cardiology reviewed EKG, question of new TWI. Patient's pain has resolved on PPI and pepcid. Feels "100%" better. No exertional symptoms. Advised to f/u sooner if pain recurs to rule out cardiac source. PATP.

## 2011-08-11 ENCOUNTER — Telehealth: Payer: Self-pay | Admitting: Family Medicine

## 2011-08-11 NOTE — Telephone Encounter (Signed)
Thanks Aubra Pappalardo  

## 2011-08-11 NOTE — Telephone Encounter (Signed)
Message copied by Nestor Ramp on Tue Aug 11, 2011  5:04 PM ------      Message from: Durwin Reges      Created: Tue Aug 04, 2011  1:05 PM      Regarding: EKG read       Hi Dr. Jennette Kettle,      I saw Mr. Radermacher last week for epigastric pain radiating to shoulders, improved with GI cocktail in office. Dr. Perley Jain and I looked at his EKG in office and cleared him, we saw biphasic T waves in lateral leads, but the overread back today says new TW inversions. I advised him to return to your clinic to assess if pain resolved with PPI. Just wanted you to be aware of official EKG read in case his pain sounded more cardiac to you later.

## 2011-09-02 ENCOUNTER — Ambulatory Visit (INDEPENDENT_AMBULATORY_CARE_PROVIDER_SITE_OTHER): Payer: BC Managed Care – PPO | Admitting: Family Medicine

## 2011-09-02 ENCOUNTER — Encounter: Payer: Self-pay | Admitting: Family Medicine

## 2011-09-02 DIAGNOSIS — R079 Chest pain, unspecified: Secondary | ICD-10-CM

## 2011-09-03 ENCOUNTER — Encounter: Payer: Self-pay | Admitting: Family Medicine

## 2011-09-03 MED ORDER — TESTOSTERONE 20.25 MG/ACT (1.62%) TD GEL: 2.0000 | Freq: Every day | TRANSDERMAL | Status: DC

## 2011-09-03 NOTE — Progress Notes (Signed)
  Subjective:    Patient ID: Shawn Fitzpatrick, male    DOB: [birth year], 56 y.o.   MRN: [id number]  HPI  1. Followup episode of chest pain. ( see Dr Ernest Haber note pasted below) About 6 days after starting the medicine for his stomach he noticed complete resolution of the chest pain. Has not had it recur. #2. Has some questions about the new form of AndroGel and whether or not that would be useful for him. He really does not like the preparation that he has right now is he feels gritty and he doesn't want to apply it. He does notice some improvement in libido and in general well being with use.   NOTE REVIEW from initial ov related to chest pain (Dr. Ernest Haber note on 07/31/2011) )Shoulder pain. Patient began having epigastric pain radiating to bilateral shoulders two days ago. Accompanied by sensation of needing to belch, but cannot let air out. Initially thought just GI related and has been taking pepcid, not helping. Has been eating large meals over the holidays, but unable to tolerate more than a plain lettuce sandwich past 2 days. He presents for work-in appointment because pepcid bottle told him to present to MD if shoulder or chest pain and now is worried about alternate etiologies. Denies any nausea, emesis, diarrhea, constipation, diaphoresis, chest pain, edema. Does feel like his inferior ribs hurt with deep breathing and his heart beats faster with exertion.  Takes daily aspirin, unsure if enteric coated.  Review of Systems See. history of present illness above.    Objective:   Physical Exam  Vital signs reviewed. GENERAL: Well developed, well nourished, no acute distress Cardiac : Regular rate and rhythm no murmur gallop or rub ABDOMEN: Soft positive bowel sounds  EKG reviewed: Lateral T wave inversion  Which is new    Assessment & Plan:  #1. Chest pain that seemed relief with proton pump inhibitor. He did have an abnormal EKG though. I do not have available to compare with. After  reviewing distortion today at still think given his family history and risk factors that a cardiology evaluation would be a good idea and he agrees. I will set that up #2. Also think with this episode of but seems most consistent with severe esophagitis with probably I get an EGD done and I will set that up. He saw [facility] 10/27/2007 for colonoscopy #3. Testosterone replacement: Will try the new pump form and she elected any better. Here he has followup with me in a month and a half.

## 2011-10-07 ENCOUNTER — Encounter: Payer: Self-pay | Admitting: Family Medicine

## 2011-10-07 ENCOUNTER — Ambulatory Visit (INDEPENDENT_AMBULATORY_CARE_PROVIDER_SITE_OTHER): Payer: BC Managed Care – PPO | Admitting: Family Medicine

## 2011-10-07 VITALS — BP 117/87 | HR 84 | Temp 98.8°F | Ht 73.0 in | Wt 207.7 lb

## 2011-10-07 DIAGNOSIS — Z79899 Other long term (current) drug therapy: Secondary | ICD-10-CM

## 2011-10-07 DIAGNOSIS — I10 Essential (primary) hypertension: Secondary | ICD-10-CM

## 2011-10-07 DIAGNOSIS — E785 Hyperlipidemia, unspecified: Secondary | ICD-10-CM

## 2011-10-07 DIAGNOSIS — Z0000 Encounter for general adult medical examination without abnormal findings: Secondary | ICD-10-CM

## 2011-10-07 LAB — LIPID PANEL
Cholesterol: 163 mg/dL (ref 0–200)
HDL: 44 mg/dL (ref >39)
LDL Cholesterol: 93 mg/dL (ref 0–99)
Total CHOL/HDL Ratio: 3.7 Ratio
Triglycerides: 128 mg/dL (ref <150)
VLDL: 26 mg/dL (ref 0–40)

## 2011-10-07 LAB — CBC WITH DIFFERENTIAL/PLATELET
Basophils Absolute: 0 10*3/uL (ref 0.0–0.1)
Basophils Relative: 0 % (ref 0–1)
Eosinophils Absolute: 0.1 10*3/uL (ref 0.0–0.7)
Eosinophils Relative: 1 % (ref 0–5)
HCT: 48.4 % (ref 39.0–52.0)
Hemoglobin: 16.1 g/dL (ref 13.0–17.0)
Lymphocytes Relative: 29 % (ref 12–46)
Lymphs Abs: 2 10*3/uL (ref 0.7–4.0)
MCH: 27.8 pg (ref 26.0–34.0)
MCHC: 33.3 g/dL (ref 30.0–36.0)
MCV: 83.6 fL (ref 78.0–100.0)
Monocytes Absolute: 0.9 10*3/uL (ref 0.1–1.0)
Monocytes Relative: 13 % — ABNORMAL HIGH (ref 3–12)
Neutro Abs: 3.8 10*3/uL (ref 1.7–7.7)
Neutrophils Relative %: 56 % (ref 43–77)
Platelets: 336 10*3/uL (ref 150–400)
RBC: 5.79 MIL/uL (ref 4.22–5.81)
RDW: 13.9 % (ref 11.5–15.5)
WBC: 6.8 10*3/uL (ref 4.0–10.5)

## 2011-10-07 LAB — COMPLETE METABOLIC PANEL WITH GFR
ALT: 40 U/L (ref 0–53)
AST: 34 U/L (ref 0–37)
Albumin: 4.5 g/dL (ref 3.5–5.2)
Alkaline Phosphatase: 136 U/L — ABNORMAL HIGH (ref 39–117)
BUN: 9 mg/dL (ref 6–23)
CO2: 27 mEq/L (ref 19–32)
Calcium: 9.9 mg/dL (ref 8.4–10.5)
Chloride: 101 mEq/L (ref 96–112)
Creat: 1.03 mg/dL (ref 0.50–1.35)
GFR, Est African American: >89 mL/min
GFR, Est Non African American: 82 mL/min
Glucose, Bld: 97 mg/dL (ref 70–99)
Potassium: 4 mEq/L (ref 3.5–5.3)
Sodium: 139 mEq/L (ref 135–145)
Total Bilirubin: 0.7 mg/dL (ref 0.3–1.2)
Total Protein: 7.1 g/dL (ref 6.0–8.3)

## 2011-10-07 LAB — PSA: PSA: 0.43 ng/mL (ref <=4.00)

## 2011-10-07 MED ORDER — TESTOSTERONE 25 MG/2.5GM (1%) TD GEL: 1.0000 | Freq: Every day | TRANSDERMAL | Status: DC

## 2011-10-07 MED ORDER — ATORVASTATIN CALCIUM 10 MG PO TABS: 10.0000 mg | ORAL_TABLET | Freq: Every day | ORAL | Status: DC

## 2011-10-07 MED ORDER — TRIAMTERENE-HCTZ 37.5-25 MG PO TABS: 1.0000 | ORAL_TABLET | Freq: Every day | ORAL | Status: DC

## 2011-10-07 MED ORDER — FLUTICASONE PROPIONATE 50 MCG/ACT NA SUSP: 2.0000 | Freq: Every day | NASAL | Status: DC

## 2011-10-07 NOTE — Patient Instructions (Signed)
So far everything looks really very normal. I will send you a letter about your lab results. I will plan on seeing him back in one year unless something comes up. It is great to see you.

## 2011-10-12 ENCOUNTER — Encounter: Payer: Self-pay | Admitting: Family Medicine

## 2011-10-12 NOTE — Progress Notes (Signed)
  Subjective:    Patient ID: Shawn Fitzpatrick, male    DOB: 1956-02-17, 56 y.o.   MRN: [id number]  HPI Here for CPE Having no specific issues at this time Taking meds regulalry for htn and lipids without problem. Is scheduled for EGD 2 weeks to f/u on GERD issues---he has had no more sx on PPI Occasionally exercising Questions about other options for testosterone replacement as he hates the way the androgel feels and the androgel pump is too expensive. Does see some benefit from replacement tehrapy---mostly in better libido but also in improved energy Level   Review of Systems  Constitutional: Negative for activity change, appetite change, fatigue and unexpected weight change.  HENT: Negative for neck stiffness.   Eyes: Negative for pain.  Respiratory: Negative for cough and shortness of breath.   Cardiovascular: Negative for chest pain and palpitations.  Genitourinary: Negative for urgency, frequency, decreased urine volume, discharge and difficulty urinating.  Musculoskeletal: Positive for arthralgias. Negative for myalgias and joint swelling.  Skin: Negative for rash.  Neurological: Negative for dizziness and weakness.  Psychiatric/Behavioral: Negative for confusion, sleep disturbance, dysphoric mood and decreased concentration. The patient is not nervous/anxious.        Objective:   Physical Exam  Constitutional: He is oriented to person, place, and time. He appears well-developed and well-nourished.  HENT:  Head: Normocephalic.  Left Ear: External ear normal.  Nose: Nose normal.  Mouth/Throat: Oropharynx is clear and moist.  Eyes: EOM are normal. Pupils are equal, round, and reactive to light. No scleral icterus.  Neck: Neck supple. No thyromegaly present.  Cardiovascular: Normal rate, regular rhythm, normal heart sounds and intact distal pulses.   Pulmonary/Chest: Effort normal and breath sounds normal. He has no wheezes.  Abdominal: Soft. He exhibits no mass. There is no  tenderness.  Genitourinary: Rectum normal, prostate normal and penis normal.  Musculoskeletal: Normal range of motion.  Lymphadenopathy:    He has no cervical adenopathy.  Neurological: He is alert and oriented to person, place, and time. He has normal reflexes.  Skin: No rash noted.  Psychiatric: He has a normal mood and affect. His behavior is normal. Judgment and thought content normal.    Health Maintenance  Topic Date Due  . Influenza Vaccine  04/26/2012  . Tetanus/tdap  09/20/2017  . Colonoscopy  11/02/2017   Past Medical History  Diagnosis Date  . Hyperlipidemia   . Hypertension    Past Surgical History  Procedure Date  . Acromioplasty   . Knee surgery   . Vasectomy         Assessment & Plan:  cpe discussed options for testosterone replacement--he wants to stay w gel now but will try it qod. Other option would be injectable testosterone cypionate---cheaper---but injection GERD--EGD scheduled Discussed xercise labs

## 2011-10-13 ENCOUNTER — Encounter: Payer: Self-pay | Admitting: Family Medicine

## 2012-10-06 ENCOUNTER — Ambulatory Visit (INDEPENDENT_AMBULATORY_CARE_PROVIDER_SITE_OTHER): Payer: BC Managed Care – PPO | Admitting: Family Medicine

## 2012-10-06 ENCOUNTER — Encounter: Payer: Self-pay | Admitting: Family Medicine

## 2012-10-06 VITALS — BP 117/89 | HR 84 | Temp 98.6°F | Ht 73.0 in | Wt 214.0 lb

## 2012-10-06 LAB — CBC WITH DIFFERENTIAL/PLATELET
Basophils Absolute: 0 10*3/uL (ref 0.0–0.1)
Basophils Relative: 0 % (ref 0–1)
Eosinophils Absolute: 0.1 10*3/uL (ref 0.0–0.7)
Eosinophils Relative: 1 % (ref 0–5)
HCT: 42.8 % (ref 39.0–52.0)
Hemoglobin: 14.7 g/dL (ref 13.0–17.0)
Lymphocytes Relative: 30 % (ref 12–46)
Lymphs Abs: 2.3 10*3/uL (ref 0.7–4.0)
MCH: 27.1 pg (ref 26.0–34.0)
MCHC: 34.3 g/dL (ref 30.0–36.0)
MCV: 79 fL (ref 78.0–100.0)
Monocytes Absolute: 1 10*3/uL (ref 0.1–1.0)
Monocytes Relative: 13 % — ABNORMAL HIGH (ref 3–12)
Neutro Abs: 4.2 10*3/uL (ref 1.7–7.7)
Neutrophils Relative %: 56 % (ref 43–77)
Platelets: 337 10*3/uL (ref 150–400)
RBC: 5.42 MIL/uL (ref 4.22–5.81)
RDW: 14.1 % (ref 11.5–15.5)
WBC: 7.6 10*3/uL (ref 4.0–10.5)

## 2012-10-06 LAB — COMPREHENSIVE METABOLIC PANEL
ALT: 41 U/L (ref 0–53)
AST: 25 U/L (ref 0–37)
Albumin: 4.1 g/dL (ref 3.5–5.2)
Alkaline Phosphatase: 136 U/L — ABNORMAL HIGH (ref 39–117)
BUN: 11 mg/dL (ref 6–23)
CO2: 27 mEq/L (ref 19–32)
Calcium: 9.5 mg/dL (ref 8.4–10.5)
Chloride: 102 mEq/L (ref 96–112)
Creat: 0.98 mg/dL (ref 0.50–1.35)
Glucose, Bld: 98 mg/dL (ref 70–99)
Potassium: 4 mEq/L (ref 3.5–5.3)
Sodium: 140 mEq/L (ref 135–145)
Total Bilirubin: 0.6 mg/dL (ref 0.3–1.2)
Total Protein: 6.6 g/dL (ref 6.0–8.3)

## 2012-10-06 LAB — PSA: PSA: 0.42 ng/mL (ref <=4.00)

## 2012-10-06 LAB — TSH: TSH: 1.074 u[IU]/mL (ref 0.350–4.500)

## 2012-10-06 LAB — LIPID PANEL
Cholesterol: 141 mg/dL (ref 0–200)
HDL: 39 mg/dL — ABNORMAL LOW (ref >39)
LDL Cholesterol: 81 mg/dL (ref 0–99)
Total CHOL/HDL Ratio: 3.6 Ratio
Triglycerides: 104 mg/dL (ref <150)
VLDL: 21 mg/dL (ref 0–40)

## 2012-10-06 MED ORDER — TESTOSTERONE CYPIONATE 200 MG/ML IM SOLN: 200.0000 mg | INTRAMUSCULAR | Status: DC

## 2012-10-06 NOTE — Patient Instructions (Addendum)
Since you have had colonoscopy before, you can set that up yourself at your convenience sometime in 2014. I will send you a note about your labs. I have also given you a note about My Chart. You look GREAT! I will see you here for NURSE VISIT for your testosterone shots. I will see you for a CPE in a year and call us with any issues in between. Great to see yoU!

## 2012-10-12 ENCOUNTER — Encounter: Payer: Self-pay | Admitting: Family Medicine

## 2012-10-12 NOTE — Assessment & Plan Note (Signed)
Check labs I suspect his fatigue is related to unreplaced testosterone deficiency---we will try the injectable testosterione and check level in 2-3 moonths PSA today (FH prostate cancer in father at older age--asymptomatic)

## 2012-10-12 NOTE — Assessment & Plan Note (Signed)
Good control. Stable weight. Check labs. No med changes

## 2012-10-12 NOTE — Assessment & Plan Note (Signed)
Check lipids No med changes---we reviewed new recs re lipids and I thiink we will stay w current regimen as he is asx and LDL reasonable. He is low moderate risk

## 2012-10-12 NOTE — Progress Notes (Signed)
  Subjective:    Patient ID: Shawn Fitzpatrick, male    DOB: 09-Jan-1956, 57 y.o.   MRN: [id number]  HPI  CPE 1. Some fatigue---has not been using topical testosterone seconday to skin irritation. Could not afford axiron. 2. Occasional GERD--relieved by meds. Has to take them 1-2 weeks every 3 months or so. Otherwise feels pretty well.  Review of Systems Complete 14 point ROS is negative except for fatigue as per HPI. Specifically no chest pain, SOB or change in exercise tolerance.    Objective:   Physical Exam  Constitutional: He is oriented to person, place, and time. He appears well-developed and well-nourished.  HENT:  Head: Normocephalic.  Right Ear: External ear normal.  Left Ear: External ear normal.  Nose: Nose normal.  Mouth/Throat: Oropharynx is clear and moist.  Eyes: Conjunctivae and EOM are normal. Pupils are equal, round, and reactive to light. Right eye exhibits no discharge. Left eye exhibits no discharge. No scleral icterus.  Neck: Normal range of motion. Neck supple. No JVD present. No thyromegaly present.  Cardiovascular: Normal rate, regular rhythm, normal heart sounds and intact distal pulses.   No murmur heard. Pulmonary/Chest: Effort normal and breath sounds normal. He has no wheezes. He has no rales.  Abdominal: Soft. Bowel sounds are normal. He exhibits no mass. There is no tenderness. There is no guarding.  Genitourinary: Prostate normal and penis normal.  Musculoskeletal: Normal range of motion.  Lymphadenopathy:    He has no cervical adenopathy.  Neurological: He is alert and oriented to person, place, and time. He has normal reflexes. He displays normal reflexes. No cranial nerve deficit. He exhibits normal muscle tone. Coordination normal.  Skin: Skin is warm and dry.  Psychiatric: He has a normal mood and affect. His behavior is normal. Judgment and thought content normal.          Assessment & Plan:

## 2012-12-22 ENCOUNTER — Other Ambulatory Visit: Payer: Self-pay | Admitting: Family Medicine

## 2013-01-09 ENCOUNTER — Other Ambulatory Visit: Payer: Self-pay | Admitting: Family Medicine

## 2013-02-02 ENCOUNTER — Encounter: Payer: Self-pay | Admitting: Family Medicine

## 2013-02-02 ENCOUNTER — Ambulatory Visit (INDEPENDENT_AMBULATORY_CARE_PROVIDER_SITE_OTHER): Payer: BC Managed Care – PPO | Admitting: Family Medicine

## 2013-02-02 VITALS — BP 110/78 | HR 96 | Temp 98.3°F | Ht 73.0 in | Wt 214.0 lb

## 2013-02-02 DIAGNOSIS — J019 Acute sinusitis, unspecified: Secondary | ICD-10-CM | POA: Insufficient documentation

## 2013-02-02 NOTE — Assessment & Plan Note (Signed)
Patient with likely viral vs allergic sinusitis for last 4 days. Plan: to try pseudoephedrine for congestion and supportive care otherwise. Advised if not improved by the middle of next week to seek care as may need antibiotics for bacterial infection.

## 2013-02-02 NOTE — Patient Instructions (Addendum)
Nice to meet you. Please try the behind the counter pseudoephedrine. This should help decongest you. If you have not improved by the middle of next week please be seen.  Pseudoephedrine tablets What is this medicine? PSEUDOEPHEDRINE (soo doe e FED rin) is a decongestant. It is used to treat congestion of the nose or sinuses. This medicine may be used for other purposes; ask your health care provider or pharmacist if you have questions. What should I tell my health care provider before I take this medicine? They need to know if you have any of the following conditions: -diabetes -glaucoma -heart disease -high blood pressure -kidney disease -prostate trouble -taken an MAOI like Carbex, Eldepryl, Marplan, Nardil, or Parnate in last 14 days -thyroid disease -trouble passing urine -an unusual or allergic reaction to pseudoephedrine, other medicines, foods, dyes, or preservatives -pregnant or trying to get pregnant -breast-feeding How should I use this medicine? Take this medicine by mouth with a glass of water. Follow the directions on the package or prescription label. Take your medicine at regular intervals. Do not take your medicine more often than directed. Talk to your pediatrician regarding the use of this medicine in children. While this drug may be prescribed for children as young as 61 years of age for selected conditions, precautions do apply. Patients over 21 years old may have a stronger reaction and need a smaller dose. Overdosage: If you think you have taken too much of this medicine contact a poison control center or emergency room at once. NOTE: This medicine is only for you. Do not share this medicine with others. What if I miss a dose? If you miss a dose, take it as soon as you can. If it is almost time for your next dose, take only that dose. Do not take double or extra doses. What may interact with this medicine? Do not take this medicine with any of the following  medications: -bromocriptine -ergot alkaloids like dihydroergotamine, ergonovine, ergotamine, methylergonovine -MAOIs like Carbex, Eldepryl, Marplan, Nardil, and Parnate -stimulant medicines for attention disorders, weight loss, or to stay awake This medicine may also interact with the following medications: -alcohol -atropine -bretylium -caffeine -digoxin -linezolid -mecamylamine -medicines for blood pressure -medicines for depression, anxiety, or psychotic disturbances like fluoxetine, sertraline -medicines for enlarged prostate -medicines for sleep -other medicines for cold, cough, or allergy -procarbazine -reserpine -some heart medicines like metoprolol -St. John's Wort This list may not describe all possible interactions. Give your health care provider a list of all the medicines, herbs, non-prescription drugs, or dietary supplements you use. Also tell them if you smoke, drink alcohol, or use illegal drugs. Some items may interact with your medicine. What should I watch for while using this medicine? Tell your doctor or healthcare professional if your symptoms do not start to get better or if they get worse. See your doctor if you are not better in 7 days or if you have a fever. What side effects may I notice from receiving this medicine? Side effects that you should report to your doctor or health care professional as soon as possible: -allergic reactions like skin rash, itching or hives, swelling of the face, lips, or tongue -bloody diarrhea with stomach pain -breathing problems -chest pain -confused, agitated, nervous -fast, irregular heartbeat -feeling faint or lightheaded, falls -hallucinations -high blood pressure -pain, tingling, numbness in the hands or feet -trouble passing urine or change in the amount of urine -trouble sleeping Side effects that usually do not require medical attention (report  to your doctor or health care professional if they continue or are  bothersome): -headache -loss of appetite -nausea, stomach upset This list may not describe all possible side effects. Call your doctor for medical advice about side effects. You may report side effects to FDA at 1-800-FDA-1088. Where should I keep my medicine? Keep out of the reach of children. Store at room temperature between 15 and 25 degrees C (59 and 77 degrees F). Protect from heat and moisture. Throw away any unused medicine after the expiration date. NOTE: This sheet is a summary. It may not cover all possible information. If you have questions about this medicine, talk to your doctor, pharmacist, or health care provider.  2012, [employer] Standard. (02/13/2008 3:31:39 PM)

## 2013-02-02 NOTE — Progress Notes (Signed)
  Subjective:    Patient ID: Shawn Fitzpatrick, male    DOB: 01-Jan-1956, 57 y.o.   MRN: [id number]  HPI SINUSITIS  Onset: Sunday. Has been worsening. Initially improved with OTC remedies, though now not getting better. Worse with: laying down Better with: nothing  Has tried nyquil and mucinex  Symptoms Cough: no Runny nose: yes, also post-nasal drip, blowing out clear mucus Fever: no    Sinus Pressure: yes, maxillary  Ears Blocked: yes  Frontal Headache: yes, occasionally   Sore throat: yes  Review of Systems see HPI     Objective:   Physical Exam  Constitutional: He appears well-developed and well-nourished.  HENT:  Head: Normocephalic and atraumatic.  Nose: Nose normal.  Mouth/Throat: Oropharynx is clear and moist. No oropharyngeal exudate.  TM bilaterally normal  Eyes: Conjunctivae are normal. Pupils are equal, round, and reactive to light. Right eye exhibits no discharge. Left eye exhibits no discharge.  Neck: Neck supple.  Cardiovascular: Normal rate, regular rhythm and normal heart sounds.   Pulmonary/Chest: Effort normal and breath sounds normal.  Lymphadenopathy:    He has no cervical adenopathy.  Skin: Skin is warm and dry.  BP 110/78  Pulse 96  Temp(Src) 98.3 F (36.8 C) (Oral)  Ht 6\' 1"  (1.854 m)  Wt 214 lb (97.07 kg)  BMI 28.24 kg/m2    Assessment & Plan:

## 2013-05-04 ENCOUNTER — Emergency Department (HOSPITAL_COMMUNITY)
Admission: EM | Admit: 2013-05-04 | Discharge: 2013-05-04 | Disposition: A | Discharge status: Home / Self Care | Payer: BC Managed Care – PPO | Source: Home / Self Care

## 2013-05-04 ENCOUNTER — Encounter (HOSPITAL_COMMUNITY): Payer: Self-pay | Admitting: Emergency Medicine

## 2013-05-04 DIAGNOSIS — B029 Zoster without complications: Secondary | ICD-10-CM

## 2013-05-04 MED ORDER — VALACYCLOVIR HCL 1 G PO TABS: 1000.0000 mg | ORAL_TABLET | Freq: Three times a day (TID) | ORAL | Status: AC

## 2013-05-04 MED ORDER — HYDROCODONE-ACETAMINOPHEN 5-325 MG PO TABS: 1.0000 | ORAL_TABLET | ORAL | Status: DC | PRN

## 2013-05-04 NOTE — ED Provider Notes (Signed)
CSN: [id number]     Arrival date & time 05/04/13  4098 History   First MD Initiated Contact with Patient 05/04/13 [phone]     Chief Complaint  Patient presents with  . Herpes Zoster   (Consider location/radiation/quality/duration/timing/severity/associated sxs/prior Treatment) HPI Comments: 57 year old male presents with a tender rash to the left neck and shoulder that developed 3 days ago.   Past Medical History  Diagnosis Date  . Hyperlipidemia   . Hypertension    Past Surgical History  Procedure Laterality Date  . Acromioplasty    . Knee surgery    . Vasectomy     Family History  Problem Relation Age of Onset  . Stroke Father 61  . Cancer Father 24    prostate cancer   History  Substance Use Topics  . Smoking status: Never Smoker   . Smokeless tobacco: Never Used  . Alcohol Use: No    Review of Systems  Constitutional: Positive for activity change and fatigue. Negative for fever.  HENT: Negative.   Respiratory: Negative.   Cardiovascular: Negative.   Gastrointestinal: Negative.   Genitourinary: Negative.   Skin: Positive for rash.    Allergies  Review of patient's allergies indicates no known allergies.  Home Medications   Current Outpatient Rx  Name  Route  Sig  Dispense  Refill  . aspirin 81 MG EC tablet   Oral   Take 81 mg by mouth daily.           Marland Kitchen atorvastatin (LIPITOR) 10 MG tablet      TAKE 1 TABLET BY MOUTH DAILY   90 tablet   3   . triamterene-hydrochlorothiazide (MAXZIDE-25) 37.5-25 MG per tablet   Oral   Take 1 each (1 tablet total) by mouth daily.   90 tablet   3   . fluticasone (FLONASE) 50 MCG/ACT nasal spray   Nasal   Place 2 sprays into the nose daily. Each nostril   48 g   g   . HYDROcodone-acetaminophen (NORCO/VICODIN) 5-325 MG per tablet   Oral   Take 1 tablet by mouth every 4 (four) hours as needed for pain.   15 tablet   0   . EXPIRED: omeprazole (PRILOSEC) 20 MG capsule   Oral   Take 1 capsule (20 mg total) by  mouth daily.   30 capsule   3   . testosterone cypionate (DEPOTESTOTERONE CYPIONATE) 200 MG/ML injection   Intramuscular   Inject 1 mL (200 mg total) into the muscle every 28 (twenty-eight) days.   10 mL   1   . triamterene-hydrochlorothiazide (DYAZIDE) 37.5-25 MG per capsule      TAKE 1 CAPSULE BY MOUTH ONCE DAILY   90 capsule   3   . valACYclovir (VALTREX) 1000 MG tablet   Oral   Take 1 tablet (1,000 mg total) by mouth 3 (three) times daily.   21 tablet   0    BP 112/80  Pulse 89  Temp(Src) 98.4 F (36.9 C) (Oral)  Resp 16  SpO2 99% Physical Exam  Nursing note and vitals reviewed. Constitutional: He is oriented to person, place, and time. He appears well-developed and well-nourished. No distress.  Eyes: Conjunctivae and EOM are normal.  Neck: Normal range of motion. Neck supple.  Cardiovascular: Normal rate.   Pulmonary/Chest: Effort normal. No respiratory distress.  Musculoskeletal: He exhibits no edema and no tenderness.  Neurological: He is alert and oriented to person, place, and time. He exhibits normal muscle tone.  Skin:  Skin is warm. Rash noted.  There are several scattered crops of papulovesicular lesions with an underlying erythematous base covering the left side of the neck and the superior portion of the left shoulder and deltoid area and the uppermost left chest. The distribution is of the C4,C5, C6 dermatomes. No lesions to the right hemisphere.  Psychiatric: He has a normal mood and affect.    ED Course  Procedures (including critical care time) Labs Review Labs Reviewed - No data to display Imaging Review No results found.  MDM   1. Herpes zoster     ) Valtrex 3 times a day for 7 days Norco 5 mg q. 4-6 hours when necessary pain Patient declined steroids For any worsening new symptoms or problems may return. They also follow up with your PCP.  Hayden Rasmussen, NP 05/04/13 [phone]

## 2013-05-04 NOTE — ED Notes (Signed)
Pt c/o rash/shingles onset 10/5 Rash on left shoulder and left upper back Sxs include: pain Denies: fevers Alert w/no signs of acute distress.

## 2013-05-05 NOTE — ED Provider Notes (Signed)
Medical screening examination/treatment/procedure(s) were performed by a resident physician or non-physician practitioner and as the supervising physician I was immediately available for consultation/collaboration.  Advaith Lamarque, MD    Tiona Ruane S Shavaughn Seidl, MD 05/05/13 0733 

## 2013-05-11 ENCOUNTER — Telehealth: Payer: Self-pay | Admitting: Family Medicine

## 2013-05-11 NOTE — Telephone Encounter (Signed)
Spoke w pt Zoster Seen at [facility] Doing better Denny Levy

## 2013-05-11 NOTE — Telephone Encounter (Signed)
Shawn Fitzpatrick wanted to talk with you regarding his recent shingles outbreak.  Was treated and is doing quite well now.  Was given Valtrex and patient took last dose last night.  Was on med for one week.  Please return call at earliest convenience.

## 2013-05-25 ENCOUNTER — Encounter: Payer: Self-pay | Admitting: Sports Medicine

## 2013-05-25 ENCOUNTER — Ambulatory Visit (INDEPENDENT_AMBULATORY_CARE_PROVIDER_SITE_OTHER): Payer: BC Managed Care – PPO | Admitting: Sports Medicine

## 2013-05-25 VITALS — BP 130/84 | HR 77 | Temp 97.9°F | Wt 213.0 lb

## 2013-05-25 DIAGNOSIS — L989 Disorder of the skin and subcutaneous tissue, unspecified: Secondary | ICD-10-CM | POA: Insufficient documentation

## 2013-05-25 DIAGNOSIS — Z23 Encounter for immunization: Secondary | ICD-10-CM

## 2013-05-25 MED ORDER — MUPIROCIN 2 % EX OINT: TOPICAL_OINTMENT | Freq: Three times a day (TID) | CUTANEOUS | Status: DC

## 2013-05-25 NOTE — Progress Notes (Signed)
[facility] - 57 y.o. male MRN [id number]  Date of birth: [birth year]  CC, HPI, INTERVAL HISTORY & ROS  Shawn Fitzpatrick is here today for evaluation of left sided hip skin lesion.    He reports one week of present of left-sided lateral belt line skin lesion that appeared following a trip to [facility] for business.  Reports he was wearing a belt clip and bumped it against something but does not remember any specific penetrating injury.  Reports it became red swollen indurated and then spontaneously was draining.  Concern this may be a recurrence of shingles as he recently had this earlier this month.  Pt denies any fevers, chills, or rigors.  He has been using hydrogen peroxide on a daily basis to keep the lesion clean; no other intervention  History  Past Medical, Surgical, Social, and Family History Reviewed per EMR Medications and Allergies reviewed and all updated if necessary. Objective Findings  VITALS: HR: 77 bpm  BP: 130/84 mmHg  TEMP: 97.9 F (36.6 C) (Oral)  RESP:    HT:    WT: 213 lb (96.616 kg)  BMI:     BP Readings from Last 3 Encounters:  05/25/13 130/84  05/04/13 112/80  02/02/13 110/78   Wt Readings from Last 3 Encounters:  05/25/13 213 lb (96.616 kg)  02/02/13 214 lb (97.07 kg)  10/06/12 214 lb (97.07 kg)     PHYSICAL EXAM: GENERAL:  adult Caucasian male. In no discomfort; no respiratory distress  PSYCH: alert and appropriate, good insight   HNEENT:   CARDIO:   LUNGS:  breathing is normal without being labored   ABDOMEN:   EXTREM:    GU:   SKIN:  left-sided belt line there is a 4 cm circular, well-circumscribed indurated and slightly raised lesion with a central area of yellow  crusting, minimally warm, no expressible exudate, most consistent with a healing abscess     Assessment & Plan   Problems addressed today: General Plan & Pt Instructions:  1. Skin lesion, superficial   2. Need for prophylactic  vaccination and inoculation against influenza       Use mupirocin ointment to 3 times daily for the next 3 days.  Keep covered.  If worsening or new spreading lesions consistent with shingles previously please call for Valtrex however this seems more consistent with an healing abscess.  Stop using hydrogen peroxide.   flu shot today     For further discussion of A/P and for follow up issues see problem based charting if applicable.

## 2013-05-25 NOTE — Assessment & Plan Note (Addendum)
Left lateral hip, on belt line, consistent with healing abscess. Mupirocin ointment 2-3 times daily X 3 days or until healed. Stop using Hydrogen peroxide > Consider Singles but does not appear consistent; most likely healing abscess.  If patient calls with worsening symptoms will send in Valtrex without return visit

## 2013-05-25 NOTE — Patient Instructions (Signed)
   Use mupirocin ointment to 3 times daily for the next 3 days.  Keep covered.  If worsening or new spreading lesions consistent with shingles previously please call for Valtrex however this seems more consistent with an healing abscess.  Stop using hydrogen peroxide.   If you need anything prior to your next visit please call the clinic. Please Bring all medications or accurate medication list with you to each appointment; an accurate medication list is essential in providing you the best care possible.

## 2013-06-16 ENCOUNTER — Telehealth: Payer: Self-pay | Admitting: Family Medicine

## 2013-06-21 ENCOUNTER — Ambulatory Visit (INDEPENDENT_AMBULATORY_CARE_PROVIDER_SITE_OTHER): Payer: BC Managed Care – PPO | Admitting: Sports Medicine

## 2013-06-21 ENCOUNTER — Encounter: Payer: Self-pay | Admitting: Sports Medicine

## 2013-06-21 VITALS — BP 122/87 | HR 83 | Temp 99.0°F | Ht 73.0 in | Wt 213.0 lb

## 2013-06-21 DIAGNOSIS — L989 Disorder of the skin and subcutaneous tissue, unspecified: Secondary | ICD-10-CM

## 2013-06-21 NOTE — Assessment & Plan Note (Signed)
Significantly improved however patient is correct there is a small 4 x 4 mm area of slow healing likely prohibited by scab encrustation.  The area was cleaned and triple antibiotic ointment was applied.  In instructed to use a Q-tip with Vaseline on a daily basis and keep covered.  Keep clean.  Do not allow scab forming in.  Followup as needed.

## 2013-06-21 NOTE — Progress Notes (Signed)
  Shawn Fitzpatrick - 57 y.o. male MRN [id number]  Date of birth: 1956/01/17  CC, HPI, INTERVAL HISTORY & ROS  Shawn Fitzpatrick is here today for followup of left-sided lateral line abscess.    He reports doing significantly better since his last visit last month.  His pain has 100% improved.  He is concerned however that he has not completely healed from this and that there is a scab that is over it.    Pt denies any fevers, chills, or rigors.  All his symptoms have resolved he does notice some darkening and a central scab that is over this area  There is no drainage  He has a mupirocin but has not been using it since this had essentially fully healed.  He is mainly concerned do to no progression in the wound since he stopped using mupirocin   History  Past Medical, Surgical, Social, and Family History Reviewed per EMR Medications and Allergies reviewed and all updated if necessary. Objective Findings  VITALS: HR: 83 bpm  BP: 122/87 mmHg  TEMP: 99 F (37.2 C) (Oral)  RESP:    HT: 6\' 1"  (185.4 cm)  WT: 213 lb (96.616 kg)  BMI: 28.2   BP Readings from Last 3 Encounters:  06/21/13 122/87  05/25/13 130/84  05/04/13 112/80   Wt Readings from Last 3 Encounters:  06/21/13 213 lb (96.616 kg)  05/25/13 213 lb (96.616 kg)  02/02/13 214 lb (97.07 kg)     PHYSICAL EXAM: GENERAL:  adult Caucasian male. In no discomfort; no respiratory distress  PSYCH: alert and appropriate, good insight   HNEENT:   CARDIO:   LUNGS:   ABDOMEN:   EXTREM:    GU:   SKIN:  left-sided belt line with a well-healed scar consistent with a prior MRSA abscess.  There is a small linear area of encrusted skin that is approximately 4 mm.  There is no fluctuance, there is no surrounding erythema.  There is some postinflammatory hyperpigmentation   The area was bluntly debrided with a curet and there was a small subcutaneous track approximately 4 mm deep  (total pocket size a 4 x 4 mm).    Assessment & Plan   Problems  addressed today: General Plan & Pt Instructions:  1. Skin lesion, superficial       Clean the area with a washcloth each day in the shower, then apply ointment and Band-Aid       For further discussion of A/P and for follow up issues see problem based charting if applicable.

## 2013-09-27 ENCOUNTER — Ambulatory Visit (INDEPENDENT_AMBULATORY_CARE_PROVIDER_SITE_OTHER): Payer: BC Managed Care – PPO | Admitting: Family Medicine

## 2013-09-27 ENCOUNTER — Encounter: Payer: Self-pay | Admitting: Family Medicine

## 2013-09-27 VITALS — BP 114/74 | HR 85 | Temp 97.9°F | Ht 73.0 in | Wt 215.1 lb

## 2013-09-27 DIAGNOSIS — Z0000 Encounter for general adult medical examination without abnormal findings: Secondary | ICD-10-CM

## 2013-09-27 DIAGNOSIS — R5381 Other malaise: Secondary | ICD-10-CM

## 2013-09-27 DIAGNOSIS — E785 Hyperlipidemia, unspecified: Secondary | ICD-10-CM

## 2013-09-27 DIAGNOSIS — R5383 Other fatigue: Secondary | ICD-10-CM

## 2013-09-27 DIAGNOSIS — I10 Essential (primary) hypertension: Secondary | ICD-10-CM

## 2013-09-27 LAB — CBC WITH DIFFERENTIAL/PLATELET
BASOS ABS: 0 10*3/uL (ref 0.0–0.1)
Basophils Relative: 0 % (ref 0–1)
EOS ABS: 0.2 10*3/uL (ref 0.0–0.7)
Eosinophils Relative: 2 % (ref 0–5)
HCT: 43.8 % (ref 39.0–52.0)
Hemoglobin: 15.3 g/dL (ref 13.0–17.0)
LYMPHS ABS: 2.2 10*3/uL (ref 0.7–4.0)
LYMPHS PCT: 29 % (ref 12–46)
MCH: 27.6 pg (ref 26.0–34.0)
MCHC: 34.9 g/dL (ref 30.0–36.0)
MCV: 78.9 fL (ref 78.0–100.0)
Monocytes Absolute: 1.1 10*3/uL — ABNORMAL HIGH (ref 0.1–1.0)
Monocytes Relative: 14 % — ABNORMAL HIGH (ref 3–12)
NEUTROS PCT: 55 % (ref 43–77)
Neutro Abs: 4.2 10*3/uL (ref 1.7–7.7)
PLATELETS: 330 10*3/uL (ref 150–400)
RBC: 5.55 MIL/uL (ref 4.22–5.81)
RDW: 13.8 % (ref 11.5–15.5)
WBC: 7.6 10*3/uL (ref 4.0–10.5)

## 2013-09-27 LAB — LIPID PANEL
Cholesterol: 139 mg/dL (ref 0–200)
HDL: 40 mg/dL (ref >39)
LDL Cholesterol: 75 mg/dL (ref 0–99)
TRIGLYCERIDES: 120 mg/dL (ref <150)
Total CHOL/HDL Ratio: 3.5 Ratio
VLDL: 24 mg/dL (ref 0–40)

## 2013-09-27 LAB — COMPREHENSIVE METABOLIC PANEL
ALBUMIN: 4.1 g/dL (ref 3.5–5.2)
ALT: 54 U/L — ABNORMAL HIGH (ref 0–53)
AST: 30 U/L (ref 0–37)
Alkaline Phosphatase: 111 U/L (ref 39–117)
BUN: 11 mg/dL (ref 6–23)
CHLORIDE: 102 meq/L (ref 96–112)
CO2: 25 meq/L (ref 19–32)
Calcium: 9.2 mg/dL (ref 8.4–10.5)
Creat: 0.88 mg/dL (ref 0.50–1.35)
GLUCOSE: 100 mg/dL — AB (ref 70–99)
POTASSIUM: 4 meq/L (ref 3.5–5.3)
SODIUM: 137 meq/L (ref 135–145)
TOTAL PROTEIN: 6.7 g/dL (ref 6.0–8.3)
Total Bilirubin: 0.6 mg/dL (ref 0.2–1.2)

## 2013-09-27 LAB — VITAMIN B12: Vitamin B-12: 470 pg/mL (ref 211–911)

## 2013-09-27 MED ORDER — ATORVASTATIN CALCIUM 10 MG PO TABS: ORAL_TABLET | ORAL | Status: DC

## 2013-09-27 MED ORDER — TRIAMTERENE-HCTZ 37.5-25 MG PO CAPS: ORAL_CAPSULE | ORAL | Status: DC

## 2013-09-28 ENCOUNTER — Encounter: Payer: Self-pay | Admitting: Family Medicine

## 2013-09-28 LAB — VITAMIN D 25 HYDROXY (VIT D DEFICIENCY, FRACTURES): Vit D, 25-Hydroxy: 45 ng/mL (ref 30–89)

## 2013-09-28 LAB — TESTOSTERONE: TESTOSTERONE: 381 ng/dL (ref 300–890)

## 2013-09-28 LAB — PSA: PSA: 0.36 ng/mL (ref <=4.00)

## 2013-09-28 NOTE — Assessment & Plan Note (Signed)
We'll continue current medicine and check some labs today he seems to be doing well.

## 2013-09-28 NOTE — Assessment & Plan Note (Signed)
He seems to be doing well. We'll check some labs today to followup his malaise. Notably is not taking the testosterone replacement so that may be part of the issue. Also recheck his testosterone. He just does not want to consider taking that right now after reading the list of side effects from the medication. We discussed increasing regular exercise. For preventive measures will get his PSA today and also did a prostate exam at his request. He's had a colonoscopy in the last 5 years.. He's also had his eye exam last 6 months.

## 2013-09-28 NOTE — Progress Notes (Signed)
   Subjective:    Patient ID: Shawn Fitzpatrick, male    DOB: 1956-06-21, 58 y.o.   MRN: [id number]  HPI Is here for his annual exam. He's been doing well. He did have an episode of shingles several months ago and that was pretty pourable for him. He feels like he still has some mild scarring in the left flank area from the shingles but otherwise the symptoms do resolve. He notes is a little more fatigued than he thinks he should be. Is not exercising regularly. 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 His home life is doing well. He is taking his chronic medications regularly without any problem.   Review of Systems  Constitutional: Negative for activity change, appetite change and unexpected weight change.  HENT: Positive for ear pain.   Eyes: Negative for pain and visual disturbance.  Respiratory: Negative for cough and chest tightness.   Cardiovascular: Negative for chest pain.  Gastrointestinal: Negative for abdominal pain, diarrhea, constipation and blood in stool.  Genitourinary: Negative for dysuria.  Musculoskeletal: Negative for joint swelling and myalgias.  Skin: Negative for rash.  Neurological: Negative for dizziness, tremors and weakness.  Psychiatric/Behavioral: Negative for hallucinations, sleep disturbance, dysphoric mood and decreased concentration. The patient is not nervous/anxious and is not hyperactive.        Objective:   Physical Exam  Constitutional: He is oriented to person, place, and time. He appears well-developed and well-nourished.  HENT:  Head: Normocephalic and atraumatic.  Right Ear: External ear normal.  Left Ear: External ear normal.  Nose: Nose normal.  Mouth/Throat: Oropharynx is clear and moist.  Eyes: Conjunctivae and EOM are normal. Pupils are equal, round, and reactive to light.  Neck: Normal range of motion. Neck supple.  Cardiovascular: Normal rate, regular  rhythm, normal heart sounds and intact distal pulses.   Pulmonary/Chest: Effort normal and breath sounds normal. He has no wheezes.  Genitourinary: Rectum normal and prostate normal.  Musculoskeletal: He exhibits no edema.  Lymphadenopathy:    He has no cervical adenopathy.  Neurological: He is alert and oriented to person, place, and time. He has normal reflexes. He exhibits normal muscle tone. Coordination normal.  Skin: No rash noted.  Psychiatric: He has a normal mood and affect. His behavior is normal. Judgment and thought content normal.          Assessment & Plan:

## 2013-10-13 ENCOUNTER — Encounter: Payer: Self-pay | Admitting: Family Medicine

## 2014-02-27 ENCOUNTER — Ambulatory Visit (INDEPENDENT_AMBULATORY_CARE_PROVIDER_SITE_OTHER): Payer: BC Managed Care – PPO | Admitting: Family Medicine

## 2014-02-27 ENCOUNTER — Encounter: Payer: Self-pay | Admitting: Family Medicine

## 2014-02-27 VITALS — BP 133/79 | HR 79 | Temp 98.2°F | Resp 16 | Ht 73.0 in | Wt 213.0 lb

## 2014-02-27 DIAGNOSIS — J309 Allergic rhinitis, unspecified: Secondary | ICD-10-CM | POA: Diagnosis not present

## 2014-02-27 MED ORDER — FEXOFENADINE HCL 30 MG PO TABS: 60.0000 mg | ORAL_TABLET | Freq: Two times a day (BID) | ORAL | Status: DC

## 2014-02-27 NOTE — Assessment & Plan Note (Signed)
Most likely allergic rhinitis given his symptoms. Has a no signs of infection.  - flonase 2 puffs BID for 2 weeks then daily dosing  - allegra, consider another agent if not responding to it after a few days.  - Afrin OTC for three days.  - recommended nasal saline rinses's and gargling with salt water.

## 2014-02-27 NOTE — Progress Notes (Signed)
   Subjective:    Patient ID: Shawn Fitzpatrick, male    DOB: 1956/03/26, 58 y.o.   MRN: [id number]  HPI Shawn Fitzpatrick is here in same day clinic for ear fullness.   Presents with 3 day history of ear fullness. He has tried OTC decongestant but no relief. He denies any vertigo but reports a history of similar symptoms that lead to vertigo. He has sneezing, rhinorrhea, and post nasal drip. Denies fever or chills. He feels like a pressure is in his ear. No decrease in hearing and no trouble swallowing.   Current Outpatient Prescriptions on File Prior to Visit  Medication Sig Dispense Refill  . aspirin 81 MG EC tablet Take 81 mg by mouth daily.        Marland Kitchen. atorvastatin (LIPITOR) 10 MG tablet TAKE 1 TABLET BY MOUTH DAILY  90 tablet  3  . fluticasone (FLONASE) 50 MCG/ACT nasal spray Place 2 sprays into the nose daily. Each nostril  48 g  g  . HYDROcodone-acetaminophen (NORCO/VICODIN) 5-325 MG per tablet Take 1 tablet by mouth every 4 (four) hours as needed for pain.  15 tablet  0  . mupirocin ointment (BACTROBAN) 2 % Apply topically 3 (three) times daily.  22 g  0  . omeprazole (PRILOSEC) 20 MG capsule Take 1 capsule (20 mg total) by mouth daily.  30 capsule  3  . testosterone cypionate (DEPOTESTOTERONE CYPIONATE) 200 MG/ML injection Inject 1 mL (200 mg total) into the muscle every 28 (twenty-eight) days.  10 mL  1  . triamterene-hydrochlorothiazide (DYAZIDE) 37.5-25 MG per capsule TAKE 1 CAPSULE BY MOUTH ONCE DAILY  90 capsule  3  . triamterene-hydrochlorothiazide (MAXZIDE-25) 37.5-25 MG per tablet Take 1 each (1 tablet total) by mouth daily.  90 tablet  3   No current facility-administered medications on file prior to visit.    Review of Systems See HPI     Objective:   Physical Exam BP 133/79  Pulse 79  Temp(Src) 98.2 F (36.8 C)  Resp 16  Ht 6\' 1"  (1.854 m)  Wt 213 lb (96.616 kg)  BMI 28.11 kg/m2 Gen: NAD, alert, cooperative with exam, well-appearing HEENT: TM's intact and clear  b/l, turbinates are erythematous and edematous b/l, no tonsillar exudates, no anterior or cervical LAD, clear conjunctiva, EOMI      Assessment & Plan:

## 2014-02-27 NOTE — Patient Instructions (Signed)
Thank you for coming in,   You can increase the Flonase to 2 puffs twice a day for 2 weeks.   I'll send in allegra. If this doesn't help with the sneezing and itchy, watery eyes then call in and I can send in another type.   You can try Afrin to help with the congestion. This is an over the counter medication but only use it for three days.    Please feel free to call with any questions or concerns at any time, at [phone]. --Dr. Jordan LikesSchmitz

## 2014-08-10 ENCOUNTER — Encounter: Payer: Self-pay | Admitting: Family Medicine

## 2014-08-10 ENCOUNTER — Ambulatory Visit (INDEPENDENT_AMBULATORY_CARE_PROVIDER_SITE_OTHER): Payer: BLUE CROSS/BLUE SHIELD | Admitting: Family Medicine

## 2014-08-10 VITALS — BP 122/81 | HR 77 | Ht 73.0 in | Wt 209.0 lb

## 2014-08-10 DIAGNOSIS — M25511 Pain in right shoulder: Secondary | ICD-10-CM

## 2014-08-10 DIAGNOSIS — S43439S Superior glenoid labrum lesion of unspecified shoulder, sequela: Secondary | ICD-10-CM | POA: Diagnosis not present

## 2014-08-10 DIAGNOSIS — S43431S Superior glenoid labrum lesion of right shoulder, sequela: Secondary | ICD-10-CM

## 2014-08-10 MED ORDER — METHYLPREDNISOLONE ACETATE 80 MG/ML IJ SUSP
80.0000 mg | Freq: Once | INTRAMUSCULAR | Status: AC
  Administered 2014-08-10: 80 mg via INTRA_ARTICULAR

## 2014-08-10 MED ORDER — METHYLPREDNISOLONE ACETATE 40 MG/ML IJ SUSP: 40.0000 mg | Freq: Once | INTRAMUSCULAR | Status: DC

## 2014-08-10 NOTE — Assessment & Plan Note (Signed)
Glenohumeral corticosteroid injection under or sound guidance today. He's not improving in the next couple weeks I think he needs to be seen by his orthopedist who did his labral repair.
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
## 2014-08-27 ENCOUNTER — Telehealth: Payer: Self-pay | Admitting: *Deleted

## 2014-08-27 NOTE — Telephone Encounter (Signed)
-----   Message from Lizbeth BarkMelanie L Ceresi sent at 08/24/2014  9:05 AM EST ----- Contact: [phone] Rt shoulder feeling a little better, wants to know next step.

## 2014-08-27 NOTE — Telephone Encounter (Signed)
Pt says he shoulder is feeling a little better after the injection, but still has limited range of motion when raising arms above his head and hears a "popping" sound.  Said he would see how it feels the next couple of weeks and will follow back up with us or the orthopedic surgeon he used in the past.

## 2014-10-03 ENCOUNTER — Encounter: Payer: Self-pay | Admitting: Family Medicine

## 2014-10-03 ENCOUNTER — Ambulatory Visit (INDEPENDENT_AMBULATORY_CARE_PROVIDER_SITE_OTHER): Payer: BLUE CROSS/BLUE SHIELD | Admitting: Family Medicine

## 2014-10-03 VITALS — BP 117/78 | HR 77 | Ht 73.0 in | Wt 214.9 lb

## 2014-10-03 DIAGNOSIS — R6882 Decreased libido: Secondary | ICD-10-CM

## 2014-10-03 DIAGNOSIS — I10 Essential (primary) hypertension: Secondary | ICD-10-CM | POA: Diagnosis not present

## 2014-10-03 DIAGNOSIS — E785 Hyperlipidemia, unspecified: Secondary | ICD-10-CM

## 2014-10-03 DIAGNOSIS — Z0000 Encounter for general adult medical examination without abnormal findings: Secondary | ICD-10-CM

## 2014-10-03 DIAGNOSIS — S43431S Superior glenoid labrum lesion of right shoulder, sequela: Secondary | ICD-10-CM

## 2014-10-03 LAB — COMPREHENSIVE METABOLIC PANEL
ALK PHOS: 108 U/L (ref 39–117)
ALT: 41 U/L (ref 0–53)
AST: 28 U/L (ref 0–37)
Albumin: 4 g/dL (ref 3.5–5.2)
BUN: 14 mg/dL (ref 6–23)
CALCIUM: 9.3 mg/dL (ref 8.4–10.5)
CO2: 22 mEq/L (ref 19–32)
Chloride: 100 mEq/L (ref 96–112)
Creat: 0.87 mg/dL (ref 0.50–1.35)
Glucose, Bld: 85 mg/dL (ref 70–99)
POTASSIUM: 4 meq/L (ref 3.5–5.3)
SODIUM: 137 meq/L (ref 135–145)
Total Bilirubin: 0.8 mg/dL (ref 0.2–1.2)
Total Protein: 6.6 g/dL (ref 6.0–8.3)

## 2014-10-03 LAB — LDL CHOLESTEROL, DIRECT: LDL DIRECT: 112 mg/dL — AB

## 2014-10-03 MED ORDER — ATORVASTATIN CALCIUM 10 MG PO TABS: ORAL_TABLET | ORAL | Status: DC

## 2014-10-03 MED ORDER — TRIAMTERENE-HCTZ 37.5-25 MG PO CAPS: ORAL_CAPSULE | ORAL | Status: DC

## 2014-10-03 NOTE — Patient Instructions (Signed)
Great to see you!   

## 2014-10-04 ENCOUNTER — Encounter: Payer: Self-pay | Admitting: Family Medicine

## 2014-10-04 LAB — PSA: PSA: 0.33 ng/mL (ref <=4.00)

## 2014-10-04 NOTE — Assessment & Plan Note (Signed)
He's had his colonoscopy, tetanus and flu vaccine are updated. We discussed HIV screening and he declined. He gets his eyes checked regularly. Today we will get some blood work including cholesterol panel and PSA. We briefly discussed increasing exercise, specifically becoming more regular with it.

## 2014-10-04 NOTE — Assessment & Plan Note (Signed)
Well-controlled. Check some lab work and continue current medications. His weight has remained stable. We discussed exercise which he intermittently does.

## 2014-10-04 NOTE — Assessment & Plan Note (Signed)
We had tried some topical testosterone which he did not particularly like given the high price of the medication in he did not like the gritty feeling of applying it topically. I had given him a prescription for some injectable testosterone but he is not yet tried that. Says is not sure what he wants to do doesn't want pursue anything different today.

## 2014-10-04 NOTE — Progress Notes (Signed)
   Subjective:    Patient ID: Shawn Fitzpatrick, male    DOB: Jul 30, 1955, 59 y.o.   MRN: [id number]  HPI Here for preventive care update and a couple of other issues. #2. Continues to have problems with his right shoulder. I had seen him about a month and a half ago and given him an ultrasound-guided glenohumeral joint injection with corticosteroid. He had some improvement for a few days but essentially has had no consistent on standing improvement. He has a lot of popping. If he moves a certain way he'll hear a loud pop in his hand will go numb. He's not having any true weakness. He is left-hand dominant. #3. Continues on his chronic medications without any problems. He's taking them regularly. He admits he wishes he would exercise more but says his weight has been stable. #4. Family history of prostate cancer although it was at her [facility]. He continues to be concerned about this and like both PSA and a digital rectal exam today.   Review of Systems Review of Systems  Constitutional: Negative for: fever, activity change, appetite change, fatigue, unexpected weight change.  HEENT: Negative for: ear pain, sore throat, trouble swallowing.  No neck rigidity or stiffness.  Eyes: Negative for eye pain and no visual disturbances.  Respiratory: Negative for cough, no difficulty breathing.  No SOB. Cardiovascular: Negative for: leg swelling. No chest pain or heart palpitations. Gastrointestinal: Negative for: nausea, abdominal pain, diarrhea, constipation. Noted no blood in stool.  Endocrine: No increased urination and no increased thirst. No cold or heat intolerance that is a change from baseline. Genitourinary: No urinary frequency, no  urinary pain. No genital sores or lesions. Libido continues to be lower than he would like. Musculoskeletal: Negative for joint swelling and arthralgias.  shoulder pain as per history of present illness Neurological: Negative for dizziness, syncope, weakness and  headaches.  Psychiatric/Behavioral: Negative for: confusion, sleep disturbance, dysphoric mood, decreased concentration and agitation. The patient is not nervous/anxious.    .     Objective:   Physical Exam  Vital signs reviewed GENERALl: Well developed, well nourished, in no acute distress. NECK: Supple, FROM, without lymphadenopathy.  THYROID: normal without nodularity CAROTID ARTERIES: without bruits LUNGS: clear to auscultation bilaterally. No wheezes or rales. HEART: Regular rate and rhythm, no murmurs ABDOMEN: soft with positive bowel sounds MSK: MOE x 4.Normal strength and muscle bulk and tone in all extremities. Full range of motion bilateral shoulders although he does have some crepitus in the right shoulder and can elicit some joint popping. Humeral head seemed located. SKIN no rash. Complete skin exam on the torso had neck and upper extremities reveals no worrisome lesions. He has a few normal appearing seborrheic keratoses and moles cheery angioma. Does not have a lot of solar damage. NEURO: no focal deficits. . Intact sensation soft touch distally. VASCULAR: Dorsalis pedis pulses 2+ bilaterally equal, radial pulses 2+ bilaterally equal. Normal capillary refill. GU: Digital rectal exam reveals a smooth is normal size prostate that is nontender and not boggy.       Assessment & Plan:

## 2014-10-08 ENCOUNTER — Encounter: Payer: Self-pay | Admitting: *Deleted

## 2014-10-11 ENCOUNTER — Telehealth: Payer: Self-pay | Admitting: Family Medicine

## 2014-10-11 NOTE — Telephone Encounter (Signed)
Pt called and wanted to know what doctor and address of the place he is seeing for his PT. jw

## 2014-10-11 NOTE — Telephone Encounter (Signed)
Called and spoke with patient and informed him of the doctor and place he is seeing. [facility] Address: [address], [place], [place] [address]  [phone]

## 2015-01-29 ENCOUNTER — Ambulatory Visit (INDEPENDENT_AMBULATORY_CARE_PROVIDER_SITE_OTHER): Payer: BC Managed Care – PPO | Admitting: Family Medicine

## 2015-01-29 ENCOUNTER — Encounter: Payer: Self-pay | Admitting: Family Medicine

## 2015-01-29 VITALS — BP 136/92 | HR 81 | Temp 97.9°F | Wt 215.0 lb

## 2015-01-29 DIAGNOSIS — J309 Allergic rhinitis, unspecified: Secondary | ICD-10-CM | POA: Diagnosis not present

## 2015-01-29 DIAGNOSIS — R5383 Other fatigue: Secondary | ICD-10-CM | POA: Diagnosis not present

## 2015-01-29 DIAGNOSIS — B0229 Other postherpetic nervous system involvement: Secondary | ICD-10-CM | POA: Insufficient documentation

## 2015-01-29 MED ORDER — FEXOFENADINE HCL 30 MG PO TABS: 60.0000 mg | ORAL_TABLET | Freq: Two times a day (BID) | ORAL | Status: DC

## 2015-01-29 MED ORDER — FLUTICASONE PROPIONATE 50 MCG/ACT NA SUSP: 2.0000 | Freq: Every day | NASAL | Status: DC

## 2015-01-29 NOTE — Assessment & Plan Note (Addendum)
Clinically consistent with allergic sinusitis (b/l maxillary) with duration x 4 days, however afebrile and no purulence or significant history of recurrent sinusitis, seems unlikely to be bacterial at this time. Concern allergic/mold with recent exposure cleaning family member's basement 1 week ago, also symptoms seemed to start several days after, which makes it less clear. - Known h/o allergies and allergic rhinitis. Additionally, chart review with h/o prior allergic/viral sinusitis 1 year ago.  Plan: 1. Refilled Allegra PO - take daily x 1 month then PRN 2. Refilled Flonase for inflamed nasal turbinates 3. Start aggressive daily nasal saline 2-4x daily, flush out potential allergens and reduce congestion 4. May take Tylenol, NSAIDs PRN HA 5. Strict return criteria, low threshold to treat with empiric antibiotic course for bacterial sinusitis if not responding or worsening within 7-10 days of onset

## 2015-01-29 NOTE — Assessment & Plan Note (Signed)
New onset fatigue x 4 days, seems associated with new onset symptoms of both allergic sinusitis and left shoulder post-herpetic neuralgia. No other significant history to suggest alternate etiology of fatigue, most likely related to these illnesses. - No h/p anemia, no reports of rectal bleeding or other active bleeding. Last colonoscopy 2014 (polyps, repeat in 5 years). No weight loss or nightsweats. No sleepiness, difficulty sleeping, or h/o reported sleep apnea.  Plan: 1. Reassurance, likely related to acute episodes with sinusitis / post-herpetic neuralgia 2. No indication for extensive labs today, last CMET normal 09/2014 and CBC 09/2013 (Hgb 15), last TSH 1.074 (normal, 2014) 3. Strict return criteria to f/u 2 to 4 weeks or after resolution of current illnesses to further evaluate fatigue

## 2015-01-29 NOTE — Progress Notes (Signed)
   Subjective:    Patient ID: Bobette MoRay M Brander, male    DOB: March 05, 1956, 59 y.o.   MRN: [id number]  Patient presents for a same day appointment.  HPI  H/O SHINGLES, LEFT SHOULDER PAIN: - History of Shingles about 2 years ago (04/2013) diagnosed early and treated with Valtrex, located on area of top left and anterior shoulder, history of previous x 3 flares of this same pain, "deep muscle throbbing pain" 8/10 initially down to 6/10, started gradually later on Saturday, not provoked and no injury, however was active moving furniture 1 week prior. - Not on any neuropathic pain meds or treatment specifically for shingles flares - Taking Advil 400mg  q 4 hours with decent relief - Never received Zostavax (not covered at age < 5760) - Denies any recurrent or new rash  SINUSITIS / FATIGUE: - Describes episode 10 days ago while cleaning his mother's house out, moved a bunch of furniture out of basement, no injury or accident, felt tired at that time, he did note it was "musty" in the basement , then recovered and felt normal in interval. Also since Saturday when developed above "shingles pain" he has had sinus congestion, pressure, and felt "fatigued", state he feels "anemic", seems tired for past few days, gradually started on Saturday but no problems prior to this. - No sick contacts. Last sinus infection few years ago. - Admits to bilateral sinus pressure and congestion (worse in morning) - Denies any fevers/chills, productive cough, sore throat, earache, headache, CP, SOB, epistaxis or any active bleeding, n/v, myalgias  I have reviewed and updated the following as appropriate: allergies and current medications  Social Hx: - Never smoker  Review of Systems  See above HPI    Objective:   Physical Exam  BP 136/92 mmHg  Pulse 81  Temp(Src) 97.9 F (36.6 C) (Oral)  Wt 215 lb (97.523 kg)  Gen - well-appearing, comfortable, NAD HEENT - NCAT, b/l maxillary sinuses +TTP (frontal non-tender),  transillumination symmetrical and negative for reduced illumination, no dental pain, PERRL, EOMI, TM's b/l clear with minimal effusion, erythematous and edematous nasal turbinates w/ some congestion, oropharynx clear, MMM Neck - supple, non-tender, no LAD, no thyromegaly Heart - RRR, no murmurs heard Lungs - CTAB, no wheezing, crackles, or rhonchi. Normal work of breathing. Abd - soft, NTND, no masses, +active BS Ext - non-tender, no edema, peripheral pulses intact +2 b/l Skin - warm, dry, no rashes or scarring over left anterior and superficial shoulder Neuro - awake, alert, oriented, grossly non-focal     Assessment & Plan:   See specific A&P problem list for details.

## 2015-01-29 NOTE — Assessment & Plan Note (Signed)
H/o shingles across anterior/superior left shoulder, now with recurrent episode of post-herpetic neuralgia pain, which is improving. H/o moving furniture 1 week ago, however no injury or accidents. Without weakness or painful movements, similar to previous episodes. - No other complications or worsening. Responding to NSAIDs.  Plan: 1. Continue NSAIDs PRN pain 2. Discussed possible courses with TCAs, Gabapentin, however given infrequent flares and pt agrees to hold off on these treatments currently

## 2015-01-29 NOTE — Assessment & Plan Note (Signed)
Consistent with flare of allergic rhinitis in setting of possible allergic sinusitis.  Plan: 1. Refilled Flonase, daily use b/l x 1 month, then PRN 2. Nasal saline regularly

## 2015-01-29 NOTE — Patient Instructions (Signed)
Dear Shawn Fitzpatrick, Thank you for coming in to clinic today. It was good to see you!  1. I think that your Fatigue is related to your current symptoms, this may be a developing Sinus infection, however more likely is triggered by the exposure recently and could be allergic. - Since your symptoms have all started at same time, including Shingles pain. I think that we need to give this more time to determine what course it is taking. This is all reassuring and seems more related to a combination of the Shingles and the Sinuses, and less likely to be a more serious other cause. I do not think that blood work or other tests are indicated today. - Sent refills on Allegra and Flonase - start using daily for at least 2 to 4 weeks - Start Simply Saline or Ocean Spray - nasal saline spray 2 to 4 times a day for few weeks, then can use daily. Alternatively if you have a Netti Pot this could work as well to flush sinuses. - Take Advil as needed for shingles pain. Consider in future alternative preventative therapies.  Please schedule a follow-up appointment with Dr. Jennette KettleNeal within 2 to 4 weeks to follow-up. Call or schedule sooner follow-up within 7 to 10 days of onset symptoms if present or worsening (fevers/chills, purulent or thick sinus drainage, worsening sinus pressure/pain, concern for infection)  If you have any other questions or concerns, please feel free to call the clinic to contact me. You may also schedule an earlier appointment if necessary.  However, if your symptoms get significantly worse, please go to the Emergency Department to seek immediate medical attention.  Saralyn PilarAlexander Kayl Stogdill, DO [facility]

## 2015-08-26 ENCOUNTER — Encounter: Payer: Self-pay | Admitting: Family Medicine

## 2015-08-26 ENCOUNTER — Ambulatory Visit (INDEPENDENT_AMBULATORY_CARE_PROVIDER_SITE_OTHER): Payer: BC Managed Care – PPO | Admitting: Family Medicine

## 2015-08-26 VITALS — BP 120/88 | HR 90 | Temp 98.1°F | Ht 73.0 in | Wt 214.4 lb

## 2015-08-26 DIAGNOSIS — H8111 Benign paroxysmal vertigo, right ear: Secondary | ICD-10-CM | POA: Diagnosis not present

## 2015-08-26 MED ORDER — MECLIZINE HCL 25 MG PO TABS: 25.0000 mg | ORAL_TABLET | Freq: Three times a day (TID) | ORAL | Status: DC | PRN

## 2015-08-26 NOTE — Patient Instructions (Signed)
Benign Positional Vertigo Vertigo is the feeling that you or your surroundings are moving when they are not. Benign positional vertigo is the most common form of vertigo. The cause of this condition is not serious (is benign). This condition is triggered by certain movements and positions (is positional). This condition can be dangerous if it occurs while you are doing something that could endanger you or others, such as driving.  CAUSES In many cases, the cause of this condition is not known. It may be caused by a disturbance in an area of the inner ear that helps your brain to sense movement and balance. This disturbance can be caused by a viral infection (labyrinthitis), head injury, or repetitive motion. RISK FACTORS This condition is more likely to develop in:  Women.  People who are 50 years of age or older. SYMPTOMS Symptoms of this condition usually happen when you move your head or your eyes in different directions. Symptoms may start suddenly, and they usually last for less than a minute. Symptoms may include:  Loss of balance and falling.  Feeling like you are spinning or moving.  Feeling like your surroundings are spinning or moving.  Nausea and vomiting.  Blurred vision.  Dizziness.  Involuntary eye movement (nystagmus). Symptoms can be mild and cause only slight annoyance, or they can be severe and interfere with daily life. Episodes of benign positional vertigo may return (recur) over time, and they may be triggered by certain movements. Symptoms may improve over time. DIAGNOSIS This condition is usually diagnosed by medical history and a physical exam of the head, neck, and ears. You may be referred to a health care provider who specializes in ear, nose, and throat (ENT) problems (otolaryngologist) or a provider who specializes in disorders of the nervous system (neurologist). You may have additional testing, including:  MRI.  A CT scan.  Eye movement tests. Your  health care provider may ask you to change positions quickly while he or she watches you for symptoms of benign positional vertigo, such as nystagmus. Eye movement may be tested with an electronystagmogram (ENG), caloric stimulation, the Dix-Hallpike test, or the roll test.  An electroencephalogram (EEG). This records electrical activity in your brain.  Hearing tests. TREATMENT Usually, your health care provider will treat this by moving your head in specific positions to adjust your inner ear back to normal. Surgery may be needed in severe cases, but this is rare. In some cases, benign positional vertigo may resolve on its own in 2-4 weeks. HOME CARE INSTRUCTIONS Safety  Move slowly.Avoid sudden body or head movements.  Avoid driving.  Avoid operating heavy machinery.  Avoid doing any tasks that would be dangerous to you or others if a vertigo episode would occur.  If you have trouble walking or keeping your balance, try using a cane for stability. If you feel dizzy or unstable, sit down right away.  Return to your normal activities as told by your health care provider. Ask your health care provider what activities are safe for you. General Instructions  Take over-the-counter and prescription medicines only as told by your health care provider.  Avoid certain positions or movements as told by your health care provider.  Drink enough fluid to keep your urine clear or pale yellow.  Keep all follow-up visits as told by your health care provider. This is important. SEEK MEDICAL CARE IF:  You have a fever.  Your condition gets worse or you develop new symptoms.  Your family or friends   notice any behavioral changes.  Your nausea or vomiting gets worse.  You have numbness or a "pins and needles" sensation. SEEK IMMEDIATE MEDICAL CARE IF:  You have difficulty speaking or moving.  You are always dizzy.  You faint.  You develop severe headaches.  You have weakness in your  legs or arms.  You have changes in your hearing or vision.  You develop a stiff neck.  You develop sensitivity to light.   This information is not intended to replace advice given to you by your health care provider. Make sure you discuss any questions you have with your health care provider.   Document Released: 04/20/2006 Document Revised: 04/03/2015 Document Reviewed: 11/05/2014 Elsevier Interactive Patient Education 2016 Elsevier Inc.  

## 2015-08-29 DIAGNOSIS — H8110 Benign paroxysmal vertigo, unspecified ear: Secondary | ICD-10-CM | POA: Insufficient documentation

## 2015-08-29 NOTE — Assessment & Plan Note (Signed)
-   Meclizine - Handout given for home Epley Maneuver - Orthostatic Hypotension considered in differential, however unlikely given symptoms with turning head quickly and + [employer] - Follow up if no improvement

## 2015-08-29 NOTE — Progress Notes (Signed)
Subjective:     Patient ID: FRANCES JOYNT, male   DOB: 21-Feb-1956, 60 y.o.   MRN: [id number]  HPI Mr. Felten is a 60yo male presenting today for vertigo. - States he has had vertigo once before. This is not as severe as then (used to be constant) - Has been present intermittently for one week - Worse when changing position or turning head suddenly - Has had Meclizine in the past with some relief - Prescribed Triamterene-HCTZ.  Review of Systems Per HPI. Other systems negative.    Objective:   Physical Exam  Constitutional: He appears well-developed and well-nourished.  HENT:  + Gilberto Better to right. Tympanic membrane normal bilaterally.  Cardiovascular: Normal rate and regular rhythm.  Exam reveals no gallop and no friction rub.   No murmur heard. Pulmonary/Chest: Effort normal. No respiratory distress. He has no wheezes.  Psychiatric: He has a normal mood and affect. His behavior is normal.       Assessment and Plan:     BPPV (benign paroxysmal positional vertigo) - Meclizine - Handout given for home Epley Maneuver - Orthostatic Hypotension considered in differential, however unlikely given symptoms with turning head quickly and + [employer] - Follow up if no improvement

## 2015-10-09 ENCOUNTER — Ambulatory Visit (INDEPENDENT_AMBULATORY_CARE_PROVIDER_SITE_OTHER): Payer: BC Managed Care – PPO | Admitting: Family Medicine

## 2015-10-09 ENCOUNTER — Encounter: Payer: Self-pay | Admitting: Family Medicine

## 2015-10-09 VITALS — BP 120/80 | HR 84 | Temp 97.9°F | Ht 73.0 in | Wt 213.2 lb

## 2015-10-09 DIAGNOSIS — Z0000 Encounter for general adult medical examination without abnormal findings: Secondary | ICD-10-CM

## 2015-10-09 DIAGNOSIS — Z299 Encounter for prophylactic measures, unspecified: Secondary | ICD-10-CM

## 2015-10-09 DIAGNOSIS — I10 Essential (primary) hypertension: Secondary | ICD-10-CM | POA: Diagnosis not present

## 2015-10-09 DIAGNOSIS — R5383 Other fatigue: Secondary | ICD-10-CM

## 2015-10-09 DIAGNOSIS — E785 Hyperlipidemia, unspecified: Secondary | ICD-10-CM | POA: Diagnosis not present

## 2015-10-09 DIAGNOSIS — H8111 Benign paroxysmal vertigo, right ear: Secondary | ICD-10-CM

## 2015-10-09 MED ORDER — BUPROPION HCL ER (XL) 150 MG PO TB24: 150.0000 mg | ORAL_TABLET | Freq: Every day | ORAL | Status: DC

## 2015-10-09 NOTE — Patient Instructions (Signed)
I have sent in a Rx for Wellbutrin. It will be once a day. I will send you a note about your blood work

## 2015-10-10 LAB — LDL CHOLESTEROL, DIRECT: Direct LDL: 121 mg/dL (ref <130)

## 2015-10-10 LAB — PSA: PSA: 0.41 ng/mL (ref <=4.00)

## 2015-10-10 LAB — COMPLETE METABOLIC PANEL WITH GFR
ALBUMIN: 4.2 g/dL (ref 3.6–5.1)
ALK PHOS: 97 U/L (ref 40–115)
ALT: 36 U/L (ref 9–46)
AST: 24 U/L (ref 10–35)
BILIRUBIN TOTAL: 0.7 mg/dL (ref 0.2–1.2)
BUN: 18 mg/dL (ref 7–25)
CO2: 28 mmol/L (ref 20–31)
Calcium: 9.6 mg/dL (ref 8.6–10.3)
Chloride: 102 mmol/L (ref 98–110)
Creat: 0.93 mg/dL (ref 0.70–1.33)
GFR, Est African American: >89 mL/min (ref >=60)
GFR, Est Non African American: >89 mL/min (ref >=60)
Glucose, Bld: 88 mg/dL (ref 65–99)
POTASSIUM: 3.9 mmol/L (ref 3.5–5.3)
Sodium: 140 mmol/L (ref 135–146)
Total Protein: 7 g/dL (ref 6.1–8.1)

## 2015-10-10 LAB — HIV ANTIBODY (ROUTINE TESTING W REFLEX): HIV 1&2 Ab, 4th Generation: NONREACTIVE

## 2015-10-10 LAB — HEPATITIS C ANTIBODY: HCV AB: NEGATIVE

## 2015-10-11 ENCOUNTER — Other Ambulatory Visit: Payer: Self-pay | Admitting: Family Medicine

## 2015-10-11 ENCOUNTER — Telehealth: Payer: Self-pay | Admitting: Family Medicine

## 2015-10-11 DIAGNOSIS — Z299 Encounter for prophylactic measures, unspecified: Secondary | ICD-10-CM | POA: Insufficient documentation

## 2015-10-11 MED ORDER — TRIAMTERENE-HCTZ 37.5-25 MG PO CAPS: ORAL_CAPSULE | ORAL | Status: DC

## 2015-10-11 MED ORDER — ATORVASTATIN CALCIUM 10 MG PO TABS: ORAL_TABLET | ORAL | Status: DC

## 2015-10-11 NOTE — Progress Notes (Signed)
   Subjective:    Patient ID: Shawn Fitzpatrick, male    DOB: November 14, 1955, 60 y.o.   MRN: [id number]  HPI In for his yearly checkup. Only complaint is of some fatigue. He thinks it's related to the stressors that he's having a work. He's planning on retiring next January. He's also having to do a lot of caretaking of his mother who is beginning to have some problems with forgetfulness. She still living independently but is becoming less independent with time.   Review of Systems Complete 14 point review of systems is negative except for fatigue    Objective:   Physical Exam  Vital signs reviewed GENERALl: Well developed, well nourished, in no acute distress. NECK: Supple, FROM, without lymphadenopathy.  THYROID: normal without nodularity CAROTID ARTERIES: without bruits LUNGS: clear to auscultation bilaterally. No wheezes or rales. HEART: Regular rate and rhythm, no murmurs ABDOMEN: soft with positive bowel sounds MSK: MOE x 4 SKIN no rash NEURO: no focal deficits PSYCH: Alert and oriented 4. Affects interactive. Intact remote and recent memory. Speech is normal and fluency in content. Judgment is normal.      Assessment & Plan:

## 2015-10-11 NOTE — Assessment & Plan Note (Signed)
I think his fatigue has a lot to do with the work stressors. We discussed at length and we'll start him on Wellbutrin which she has been on before. He'll follow-up with me in about a month.

## 2015-10-11 NOTE — Telephone Encounter (Signed)
Needs refill on atorvastatin and blood pressure medicine (doesnt know name).  Pt say dr Jennette Kettleneal on Wednesday.  [facility] on [address]

## 2015-10-11 NOTE — Assessment & Plan Note (Signed)
Seems to have resolved with his exercises.

## 2015-10-11 NOTE — Assessment & Plan Note (Signed)
Colonoscopy and immunizations up-to-date. PSA today. Discussed exercise. Follow-up one year.

## 2015-10-11 NOTE — Assessment & Plan Note (Signed)
Doing well. We'll check some labs. Refill meds.

## 2015-10-15 ENCOUNTER — Encounter: Payer: Self-pay | Admitting: Family Medicine

## 2015-11-07 ENCOUNTER — Other Ambulatory Visit: Payer: Self-pay | Admitting: *Deleted

## 2015-11-11 MED ORDER — TRIAMTERENE-HCTZ 37.5-25 MG PO CAPS: ORAL_CAPSULE | ORAL | Status: DC

## 2016-02-20 ENCOUNTER — Encounter: Payer: Self-pay | Admitting: *Deleted

## 2016-10-09 ENCOUNTER — Other Ambulatory Visit: Payer: Self-pay | Admitting: Family Medicine

## 2016-10-09 DIAGNOSIS — Z0000 Encounter for general adult medical examination without abnormal findings: Secondary | ICD-10-CM

## 2016-10-14 ENCOUNTER — Ambulatory Visit (INDEPENDENT_AMBULATORY_CARE_PROVIDER_SITE_OTHER): Payer: BC Managed Care – PPO | Admitting: Family Medicine

## 2016-10-14 ENCOUNTER — Encounter: Payer: Self-pay | Admitting: Family Medicine

## 2016-10-14 VITALS — BP 118/88 | HR 83 | Temp 97.8°F | Ht 73.0 in | Wt 212.2 lb

## 2016-10-14 DIAGNOSIS — E785 Hyperlipidemia, unspecified: Secondary | ICD-10-CM

## 2016-10-14 DIAGNOSIS — Z0000 Encounter for general adult medical examination without abnormal findings: Secondary | ICD-10-CM | POA: Diagnosis not present

## 2016-10-14 DIAGNOSIS — I10 Essential (primary) hypertension: Secondary | ICD-10-CM | POA: Diagnosis not present

## 2016-10-14 NOTE — Patient Instructions (Signed)
You look great! I will send you a note about your labs and I will refill your prescriptions. Enjoy retirement! See you in a year unless something comes up.

## 2016-10-15 LAB — BASIC METABOLIC PANEL
BUN / CREAT RATIO: 12 (ref 9–20)
BUN: 12 mg/dL (ref 6–24)
CHLORIDE: 96 mmol/L (ref 96–106)
CO2: 25 mmol/L (ref 18–29)
CREATININE: 0.98 mg/dL (ref 0.76–1.27)
Calcium: 9.8 mg/dL (ref 8.7–10.2)
GFR calc Af Amer: 97 mL/min/{1.73_m2} (ref >59)
GFR calc non Af Amer: 84 mL/min/{1.73_m2} (ref >59)
GLUCOSE: 104 mg/dL — AB (ref 65–99)
Potassium: 4.1 mmol/L (ref 3.5–5.2)
SODIUM: 138 mmol/L (ref 134–144)

## 2016-10-15 LAB — PSA: PROSTATE SPECIFIC AG, SERUM: 0.5 ng/mL (ref 0.0–4.0)

## 2016-10-15 LAB — LDL CHOLESTEROL, DIRECT: LDL DIRECT: 101 mg/dL — AB (ref 0–99)

## 2016-10-18 NOTE — Assessment & Plan Note (Signed)
Update his lab work and give him some refills.  We discussed the shingles vaccine and he is amenable to getting it when he turns 61.  Congratulated on his retirement.

## 2016-10-18 NOTE — Progress Notes (Signed)
    CHIEF COMPLAINT / HPI:   here for complete physical.  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  He is extremely happy with this arrangement.  He wants to get his general checkup done but he has no specific issues or problems at this time.  REVIEW OF SYSTEMS:  Review of Systems  Constitutional: Negative for activity chang; no  appetite change and no unexpected weight change.  Eyes: Negative for eye pain and no visual disturbance.  Neck: denies neck pain; no swallowing problems CV: No chest pain, no shortness of breath, no lower extremity edema. No change in exercise tolerance Respiratory: Negative for cough or wheezing.  No shortness of breath. Gastrointestinal: Negative for abdominal pain, no diarrhea and no  constipation.  Genitourinary: Negative for decreased urine volume and  no difficulty urinating.  Musculoskeletal: Negative for arthralgias. No muscle weakness. Skin: Negative for rash.  Psychiatric/Behavioral: Negative for behavioral problems; no sleep disturbance and no  agitation.     OBJECTIVE:  Vital signs are reviewed.   Vital signs reviewed GENERALl: Well developed, well nourished, in no acute distress. HEENT: PERRLA, EOMI, sclerae are nonicteric NECK: Supple, FROM, without lymphadenopathy.  THYROID: normal without nodularity CAROTID ARTERIES: without bruits LUNGS: clear to auscultation bilaterally. No wheezes or rales. Normal respiratory effort HEART: Regular rate and rhythm, no murmurs. Distal pulses are bilaterally symmetrical, 2+. ABDOMEN: soft with positive bowel sounds. No masses noted MSK: MOE x 4. Normal muscle strength, bulk and tone. SKIN no rash. Normal temperature. NEURO: no focal deficits. Normal gait. Normal balance.   ASSESSMENT / PLAN: Please see problem oriented charting for details

## 2016-10-26 ENCOUNTER — Encounter: Payer: Self-pay | Admitting: Family Medicine

## 2017-01-16 ENCOUNTER — Other Ambulatory Visit: Payer: Self-pay | Admitting: Family Medicine

## 2017-09-01 ENCOUNTER — Encounter (HOSPITAL_COMMUNITY): Payer: Self-pay | Admitting: Emergency Medicine

## 2017-09-01 ENCOUNTER — Ambulatory Visit (HOSPITAL_COMMUNITY)
Admission: EM | Admit: 2017-09-01 | Discharge: 2017-09-01 | Disposition: A | Discharge status: Home / Self Care | Payer: BC Managed Care – PPO | Attending: Family Medicine | Admitting: Family Medicine

## 2017-09-01 ENCOUNTER — Other Ambulatory Visit: Payer: Self-pay

## 2017-09-01 DIAGNOSIS — W540XXA Bitten by dog, initial encounter: Secondary | ICD-10-CM | POA: Diagnosis not present

## 2017-09-01 DIAGNOSIS — Z23 Encounter for immunization: Secondary | ICD-10-CM

## 2017-09-01 DIAGNOSIS — S61031A Puncture wound without foreign body of right thumb without damage to nail, initial encounter: Secondary | ICD-10-CM | POA: Diagnosis not present

## 2017-09-01 DIAGNOSIS — L03011 Cellulitis of right finger: Secondary | ICD-10-CM

## 2017-09-01 MED ORDER — TETANUS-DIPHTH-ACELL PERTUSSIS 5-2.5-18.5 LF-MCG/0.5 IM SUSP
0.5000 mL | Freq: Once | INTRAMUSCULAR | Status: AC
  Administered 2017-09-01: 0.5 mL via INTRAMUSCULAR

## 2017-09-01 MED ORDER — TETANUS-DIPHTH-ACELL PERTUSSIS 5-2.5-18.5 LF-MCG/0.5 IM SUSP
INTRAMUSCULAR | Status: AC
Start: 2017-09-01 — End: 2017-09-01
  Filled 2017-09-01: qty 0.5

## 2017-09-01 MED ORDER — AMOXICILLIN 875 MG PO TABS: 875.0000 mg | ORAL_TABLET | Freq: Two times a day (BID) | ORAL | 0 refills | Status: AC

## 2017-09-01 NOTE — ED Provider Notes (Signed)
  [facility]   [id number] 09/01/17 Arrival Time: [id number]  ASSESSMENT & PLAN:  1. Dog bite, initial encounter   2. Cellulitis of finger of right hand     Meds ordered this encounter  Medications  . amoxicillin (AMOXIL) 875 MG tablet    Sig: Take 1 tablet (875 mg total) by mouth 2 (two) times daily for 10 days.    Dispense:  20 tablet    Refill:  0  . Tdap (BOOSTRIX) injection 0.5 mL   Will f/u if not seeing improvement over the next 48 hours. OTC analgesics.  Reviewed expectations re: course of current medical issues. Questions answered. Outlined signs and symptoms indicating need for more acute intervention. Patient verbalized understanding. After Visit Summary given.   SUBJECTIVE:  Shawn Fitzpatrick is a 62 y.o. male who presents with complaint of a dog bite to his R thumb; 2 days ago. His dog who is UTD on immunizations. He startled her and she snapped at him. No other injuries. Over the past 2 days reports increasing erythema and swelling to R thumb. Mild discomfort. Afebrile. No OTC treatment. "Thumb feels stiff." No extremity sensation changes or weakness. No specific aggravating or alleviating factors reported.   ROS: As per HPI.  OBJECTIVE: Vitals:   09/01/17 1009  BP: 114/80  Pulse: (!) 105  Resp: 18  Temp: 99.4 F (37.4 C)  TempSrc: Oral  SpO2: 100%    General appearance: alert; no distress Lungs: clear to auscultation bilaterally Heart: regular rate and rhythm Extremities: no edema Skin: dry; over L thumb erythematous and warm; small puncture wound at base of thumb; tender; FROM with discomfort; sensation and capillary refill intact; no areas of fluctuance Psychological: alert and cooperative; normal mood and affect  No Known Allergies  Past Medical History:  Diagnosis Date  . Hyperlipidemia   . Hypertension    Social History   Socioeconomic History  . Marital status: Married    Spouse name: Not on file  . Number of children: Not on file    . Years of education: Not on file  . Highest education level: Not on file  Social Needs  . Financial resource strain: Not on file  . Food insecurity - worry: Not on file  . Food insecurity - inability: Not on file  . Transportation needs - medical: Not on file  . Transportation needs - non-medical: Not on file  Occupational History  . Not on file  Tobacco Use  . Smoking status: Never Smoker  . Smokeless tobacco: Never Used  Substance and Sexual Activity  . Alcohol use: No    Alcohol/week: 0.0 oz  . Drug use: No  . Sexual activity: Yes    Comment: vasectomy  Other Topics Concern  . Not on file  Social History Narrative  . Not on file   Family History  Problem Relation Age of Onset  . Stroke Father [phone]  . Cancer Father [phone]       prostate cancer   Past Surgical History:  Procedure Laterality Date  . ACROMIOPLASTY    . KNEE SURGERY    . [place]       Shawn Fatima, MD 09/01/17 1040

## 2017-09-01 NOTE — ED Triage Notes (Signed)
Pt states Monday he was bit by his own dog, dog is up to date vaccines, indoor, states he startled his dog and she bit his R hand. Tetanus within 10 years. R thumb is red and swollen.

## 2017-10-11 ENCOUNTER — Other Ambulatory Visit: Payer: Self-pay | Admitting: Family Medicine

## 2017-10-11 DIAGNOSIS — Z0000 Encounter for general adult medical examination without abnormal findings: Secondary | ICD-10-CM

## 2017-10-20 ENCOUNTER — Encounter: Payer: BC Managed Care – PPO | Admitting: Family Medicine

## 2017-11-10 ENCOUNTER — Encounter: Payer: Self-pay | Admitting: Family Medicine

## 2017-11-10 ENCOUNTER — Other Ambulatory Visit: Payer: Self-pay

## 2017-11-10 ENCOUNTER — Ambulatory Visit: Payer: BC Managed Care – PPO | Admitting: Family Medicine

## 2017-11-10 VITALS — BP 112/84 | HR 70 | Temp 98.2°F | Ht 73.0 in | Wt 190.2 lb

## 2017-11-10 DIAGNOSIS — Z299 Encounter for prophylactic measures, unspecified: Secondary | ICD-10-CM | POA: Diagnosis not present

## 2017-11-10 DIAGNOSIS — Z0001 Encounter for general adult medical examination with abnormal findings: Secondary | ICD-10-CM | POA: Diagnosis not present

## 2017-11-10 DIAGNOSIS — Z0000 Encounter for general adult medical examination without abnormal findings: Secondary | ICD-10-CM

## 2017-11-10 DIAGNOSIS — I10 Essential (primary) hypertension: Secondary | ICD-10-CM

## 2017-11-10 MED ORDER — TRIAMTERENE-HCTZ 37.5-25 MG PO CAPS: 1.0000 | ORAL_CAPSULE | Freq: Every day | ORAL | 3 refills | Status: DC

## 2017-11-10 MED ORDER — BUPROPION HCL ER (XL) 150 MG PO TB24: 150.0000 mg | ORAL_TABLET | Freq: Every day | ORAL | 3 refills | Status: DC

## 2017-11-10 MED ORDER — ATORVASTATIN CALCIUM 10 MG PO TABS: 10.0000 mg | ORAL_TABLET | Freq: Every day | ORAL | 3 refills | Status: DC

## 2017-11-10 NOTE — Patient Instructions (Signed)
Great to see you! Congratulations on the weight loss. Think about the shingles shot. Call [facility] for a colonoscopy appointment.

## 2017-11-11 LAB — CMP14+EGFR
ALBUMIN: 4.3 g/dL (ref 3.6–4.8)
ALK PHOS: 91 IU/L (ref 39–117)
ALT: 20 IU/L (ref 0–44)
AST: 18 IU/L (ref 0–40)
Albumin/Globulin Ratio: 2.2 (ref 1.2–2.2)
BILIRUBIN TOTAL: 0.6 mg/dL (ref 0.0–1.2)
BUN / CREAT RATIO: 17 (ref 10–24)
BUN: 15 mg/dL (ref 8–27)
CO2: 27 mmol/L (ref 20–29)
CREATININE: 0.89 mg/dL (ref 0.76–1.27)
Calcium: 9.8 mg/dL (ref 8.6–10.2)
Chloride: 100 mmol/L (ref 96–106)
GFR calc non Af Amer: 93 mL/min/{1.73_m2} (ref >59)
GFR, EST AFRICAN AMERICAN: 107 mL/min/{1.73_m2} (ref >59)
GLOBULIN, TOTAL: 2 g/dL (ref 1.5–4.5)
Glucose: 87 mg/dL (ref 65–99)
Potassium: 4.7 mmol/L (ref 3.5–5.2)
SODIUM: 139 mmol/L (ref 134–144)
TOTAL PROTEIN: 6.3 g/dL (ref 6.0–8.5)

## 2017-11-11 LAB — PSA: PROSTATE SPECIFIC AG, SERUM: 0.5 ng/mL (ref 0.0–4.0)

## 2017-11-11 LAB — LIPID PANEL
CHOLESTEROL TOTAL: 132 mg/dL (ref 100–199)
Chol/HDL Ratio: 3.1 ratio (ref 0.0–5.0)
HDL: 42 mg/dL (ref >39)
LDL Calculated: 66 mg/dL (ref 0–99)
Triglycerides: 122 mg/dL (ref 0–149)
VLDL Cholesterol Cal: 24 mg/dL (ref 5–40)

## 2017-11-12 ENCOUNTER — Encounter: Payer: Self-pay | Admitting: Family Medicine

## 2017-11-12 NOTE — Assessment & Plan Note (Addendum)
Labs Discussed Shingrix--he is considering. Congratulated on weight loss continue current meds for chronic issues Refills Reminded colonoscopy due in a few months and he will call for appt

## 2017-11-12 NOTE — Progress Notes (Signed)
    CHIEF COMPLAINT / HPI:  check up No problems Has cut out soda completely and has lost significant weight---pleased.Is doing more walking in his part time job at [employer]. Enjoying retirement. His wife plans to retire in a few months.  Some stressors with his Mom's estate and that side of the family (he was executor)  REVIEW OF SYSTEMS: Review of Systems  Constitutional: Negative for activity chang; no  appetite change and no unexpected weight change.  Eyes: Negative for eye pain and no visual disturbance.  Neck: denies neck pain; no swallowing problems CV: No chest pain, no shortness of breath, no lower extremity edema. No change in exercise tolerance Respiratory: Negative for cough or wheezing.  No shortness of breath. Gastrointestinal: Negative for abdominal pain, no diarrhea and no  constipation.  Genitourinary: Negative for decreased urine volume and  no difficulty urinating.  Musculoskeletal: Negative for arthralgias. No muscle weakness. Skin: Negative for rash.  Psychiatric/Behavioral: Negative for behavioral problems; no sleep disturbance and no  agitation.  Marland Kitchen.   PERTINENT  PMH / PSH: I have reviewed the patient's medications, allergies, past medical and surgical history, smoking status and updated in the EMR as appropriate.   OBJECTIVE:  Vital signs reviewed GENERALl: Well developed, well nourished, in no acute distress. HEENT: PERRLA, EOMI, sclerae are nonicteric NECK: Supple, FROM, without lymphadenopathy.  THYROID: normal without nodularity CAROTID ARTERIES: without bruits LUNGS: clear to auscultation bilaterally. No wheezes or rales. Normal respiratory effort HEART: Regular rate and rhythm, no murmurs. Distal pulses are bilaterally symmetrical, 2+. ABDOMEN: soft with positive bowel sounds. No masses noted MSK: MOE x 4. Normal muscle strength, bulk and tone. SKIN no rash. Normal temperature. NEURO: no focal deficits. Normal gait. Normal  balance.   ASSESSMENT / PLAN: Please see problem oriented charting for details

## 2017-11-16 ENCOUNTER — Encounter: Payer: Self-pay | Admitting: Family Medicine

## 2018-08-17 ENCOUNTER — Other Ambulatory Visit: Payer: Self-pay

## 2018-08-17 ENCOUNTER — Ambulatory Visit (INDEPENDENT_AMBULATORY_CARE_PROVIDER_SITE_OTHER): Payer: BC Managed Care – PPO | Admitting: Family Medicine

## 2018-08-17 ENCOUNTER — Encounter: Payer: Self-pay | Admitting: Family Medicine

## 2018-08-17 VITALS — BP 108/64 | HR 64 | Temp 97.9°F | Ht 73.0 in | Wt 188.8 lb

## 2018-08-17 DIAGNOSIS — Z0000 Encounter for general adult medical examination without abnormal findings: Secondary | ICD-10-CM

## 2018-08-17 DIAGNOSIS — I10 Essential (primary) hypertension: Secondary | ICD-10-CM

## 2018-08-17 DIAGNOSIS — K2970 Gastritis, unspecified, without bleeding: Secondary | ICD-10-CM

## 2018-08-17 MED ORDER — ATORVASTATIN CALCIUM 10 MG PO TABS: 10.0000 mg | ORAL_TABLET | Freq: Every day | ORAL | 3 refills | Status: DC

## 2018-08-17 MED ORDER — OMEPRAZOLE 20 MG PO CPDR: 20.0000 mg | DELAYED_RELEASE_CAPSULE | Freq: Every day | ORAL | 3 refills | Status: DC

## 2018-08-17 MED ORDER — BUPROPION HCL ER (XL) 150 MG PO TB24: 150.0000 mg | ORAL_TABLET | Freq: Every day | ORAL | 3 refills | Status: DC

## 2018-08-17 MED ORDER — TRIAMTERENE-HCTZ 37.5-25 MG PO CAPS: 1.0000 | ORAL_CAPSULE | Freq: Every day | ORAL | 3 refills | Status: DC

## 2018-08-17 NOTE — Patient Instructions (Signed)
I will send you note about your blood work.  Everything else looks excellent.  Great to see you!

## 2018-08-18 LAB — PSA: Prostate Specific Ag, Serum: 0.3 ng/mL (ref 0.0–4.0)

## 2018-08-18 LAB — COMPREHENSIVE METABOLIC PANEL
ALT: 17 IU/L (ref 0–44)
AST: 14 IU/L (ref 0–40)
Albumin/Globulin Ratio: 2.1 (ref 1.2–2.2)
Albumin: 4.4 g/dL (ref 3.8–4.8)
Alkaline Phosphatase: 105 IU/L (ref 39–117)
BUN/Creatinine Ratio: 15 (ref 10–24)
BUN: 13 mg/dL (ref 8–27)
Bilirubin Total: 0.5 mg/dL (ref 0.0–1.2)
CALCIUM: 9.7 mg/dL (ref 8.6–10.2)
CO2: 28 mmol/L (ref 20–29)
CREATININE: 0.88 mg/dL (ref 0.76–1.27)
Chloride: 100 mmol/L (ref 96–106)
GFR calc Af Amer: 107 mL/min/{1.73_m2} (ref >59)
GFR, EST NON AFRICAN AMERICAN: 93 mL/min/{1.73_m2} (ref >59)
GLOBULIN, TOTAL: 2.1 g/dL (ref 1.5–4.5)
GLUCOSE: 84 mg/dL (ref 65–99)
Potassium: 4.3 mmol/L (ref 3.5–5.2)
SODIUM: 141 mmol/L (ref 134–144)
Total Protein: 6.5 g/dL (ref 6.0–8.5)

## 2018-08-18 LAB — LIPID PANEL
Chol/HDL Ratio: 3.1 ratio (ref 0.0–5.0)
Cholesterol, Total: 129 mg/dL (ref 100–199)
HDL: 41 mg/dL (ref >39)
LDL CALC: 70 mg/dL (ref 0–99)
Triglycerides: 92 mg/dL (ref 0–149)
VLDL Cholesterol Cal: 18 mg/dL (ref 5–40)

## 2018-08-19 NOTE — Assessment & Plan Note (Signed)
Refills and labs

## 2018-08-19 NOTE — Progress Notes (Signed)
    CHIEF COMPLAINT / HPI: CPE No problems Enjoying retirement  REVIEW OF SYSTEMS: Review of Systems  Constitutional: Negative for activity chang; no  appetite change and no unexpected weight change.  Eyes: Negative for eye pain and no visual disturbance.  Neck: denies neck pain; no swallowing problems CV: No chest pain, no shortness of breath, no lower extremity edema. No change in exercise tolerance Respiratory: Negative for cough or wheezing.  No shortness of breath. Gastrointestinal: Negative for abdominal pain, no diarrhea and no  constipation.  Genitourinary: Negative for decreased urine volume and  no difficulty urinating.  Musculoskeletal: Negative for arthralgias. No muscle weakness. Skin: Negative for rash.  Psychiatric/Behavioral: Negative for behavioral problems; no sleep disturbance and no  agitation.     PERTINENT  PMH / PSH: I have reviewed the patient's medications, allergies, past medical and surgical history, smoking status and updated in the EMR as appropriate.   OBJECTIVE:  Vital signs reviewed GENERALl: Well developed, well nourished, in no acute distress. HEENT: PERRLA, EOMI, sclerae are nonicteric NECK: Supple, FROM, without lymphadenopathy.  THYROID: normal without nodularity CAROTID ARTERIES: without bruits LUNGS: clear to auscultation bilaterally. No wheezes or rales. Normal respiratory effort HEART: Regular rate and rhythm, no murmurs. Distal pulses are bilaterally symmetrical, 2+. ABDOMEN: soft with positive bowel sounds. No masses noted MSK: MOE x 4. Normal muscle strength, bulk and tone. SKIN no rash. Normal temperature. NEURO: no focal deficits. Normal gait. Normal balance.   ASSESSMENT / PLAN:  No problem-specific Assessment & Plan notes found for this encounter.

## 2018-08-19 NOTE — Assessment & Plan Note (Signed)
Labs Has maintained weight loss and is at good weigt now rec continue good lifestyle, exercise

## 2018-08-26 ENCOUNTER — Encounter: Payer: Self-pay | Admitting: Family Medicine

## 2018-12-19 ENCOUNTER — Other Ambulatory Visit: Payer: Self-pay | Admitting: Family Medicine

## 2018-12-19 DIAGNOSIS — K2970 Gastritis, unspecified, without bleeding: Secondary | ICD-10-CM

## 2019-04-25 ENCOUNTER — Other Ambulatory Visit: Payer: Self-pay

## 2019-04-25 ENCOUNTER — Ambulatory Visit (INDEPENDENT_AMBULATORY_CARE_PROVIDER_SITE_OTHER): Payer: BC Managed Care – PPO | Admitting: Family Medicine

## 2019-04-25 VITALS — BP 128/70 | HR 72 | Wt 197.0 lb

## 2019-04-25 DIAGNOSIS — R350 Frequency of micturition: Secondary | ICD-10-CM | POA: Diagnosis not present

## 2019-04-25 DIAGNOSIS — I10 Essential (primary) hypertension: Secondary | ICD-10-CM | POA: Diagnosis not present

## 2019-04-25 DIAGNOSIS — R351 Nocturia: Secondary | ICD-10-CM | POA: Diagnosis not present

## 2019-04-25 DIAGNOSIS — M25551 Pain in right hip: Secondary | ICD-10-CM | POA: Insufficient documentation

## 2019-04-25 LAB — POCT URINALYSIS DIP (MANUAL ENTRY)
Bilirubin, UA: NEGATIVE
Blood, UA: NEGATIVE
Glucose, UA: NEGATIVE mg/dL
Ketones, POC UA: NEGATIVE mg/dL
Leukocytes, UA: NEGATIVE
Nitrite, UA: NEGATIVE
Protein Ur, POC: NEGATIVE mg/dL
Spec Grav, UA: 1.02 (ref 1.010–1.025)
Urobilinogen, UA: 0.2 E.U./dL
pH, UA: 7.5 (ref 5.0–8.0)

## 2019-04-25 NOTE — Progress Notes (Signed)
Subjective:    Patient ID: Shawn Fitzpatrick, male    DOB: Dec 01, 1955, 63 y.o.   MRN: [id number]   CC: Rt hip pain/urinary frequency  HPI: Shawn Fitzpatrick is a 63 year old gentleman with hypertension and hyperlipidemia presenting discuss the following:  Right hip pain: 1 month history. About the same severity since it started.  Present on the lateral aspect of his right upper hip, states it feels "muscular ". Taking advil, not helping. 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 Gets better with activity, but it still sore, but feels looser. Hasn't had anything like this before.  Denies any weakness, numbness, bowel/bladder incontinence, concurrent back pain, fever, unexpected weight loss.  Nocturia: Several weeks, gradual. Drinks a lot of water during the day, 2-3 bottles of water through the day. Drinks up until bedtime. Waking up 3-4 times at night to use the restroom. Not a problem during the day. Denies any urgency, hesitancy, dysuria, fever, abdominal pain, fatigue, polydipsia.  No known diabetes.  Father diagnosed with prostate cancer when he was 62 years old.  Recent PSA in 07/2018 normal.  He was additionally worried after googling his symptoms of urinary frequency and bone pain in the setting of family history of prostate cancer, that may be he had prostate cancer.  Smoking status reviewed  Review of Systems Per HPI   Objective:  BP 128/70    Pulse 72    Wt 197 lb (89.4 kg)    SpO2 98%    BMI 25.99 kg/m  Vitals and nursing note reviewed  General: NAD, pleasant Respiratory: CTAB, normal effort Abdomen: soft, nontender, nondistended Extremities: No edema or cyanosis.  Point tenderness of right lateral upper thigh with muscular tenderness to palpation along lateral aspect of thigh.  Full ROM of bilateral lower extremity.  5/5 lower extremity strength bilaterally.  Some tenderness  laterally with FABER test.  Skin: warm and dry  Rectal exam: Prostate slightly enlarged per digital exam, smooth without any nodules palpated. Neuro: alert and oriented, no focal deficits Psych: normal affect  Assessment & Plan:   Acute right hip pain Few week history of right lateral hip pain with point tenderness over IT and trochanteric bursa region. Differential including trochanteric bursitis, IT band/muscular strain, arthritis, SI joint dysfunction--however ultimately suspect a combination of bursitis with muscular strain.  Discussed hip exercises/stretches vs additionally performing bursa injection today, he would like to trial with conservative therapy prior to this. - Provided with hip exercises/stretches to do twice daily - Rest, ice/heat, foam roll - Follow-up in the next few weeks if not improving or sooner if worsening  Nocturia Suspect associated with BPH given symptomatology, age, and mildly enlarged prostate on rectal exam.  Considered UTI, however less likely without any concerning suggestive symptoms and clear U/A. Reassuringly, had normal PSA in 07/2018, suggesting against prostate cancer.  Could consider bladder dysfunction, however lower suspicion for this as urinary frequency is his main complaint.  No concern for diabetes at this time.  Discussed trialing Flomax, however patient would like to start with limiting his water intake at night first to see if this makes improvements. - Continue water intake throughout the day, stop after dinner - Follow-up if not improving in several weeks or sooner if worsening, dysuria, abdominal pains, fever, polydipsia/fatigue   Essential hypertension, benign Well-controlled.  Continue current regimen.   Follow-up if above not improving or  sooner if worsening.  Provided with work note today.  Leticia Penna DO Family Medicine Resident PGY-2

## 2019-04-25 NOTE — Assessment & Plan Note (Addendum)
Few week history of right lateral hip pain with point tenderness over IT and trochanteric bursa region. Differential including trochanteric bursitis, IT band/muscular strain, arthritis, SI joint dysfunction--however ultimately suspect a combination of bursitis with muscular strain.  Discussed hip exercises/stretches vs additionally performing bursa injection today, he would like to trial with conservative therapy prior to this. - Provided with hip exercises/stretches to do twice daily - Rest, ice/heat, foam roll - Follow-up in the next few weeks if not improving or sooner if worsening

## 2019-04-25 NOTE — Assessment & Plan Note (Signed)
Well-controlled.  Continue current regimen. 

## 2019-04-25 NOTE — Patient Instructions (Signed)
It was wonderful meeting you today.  For your right hip pain: I would like you to start do these exercises/stretches at least twice daily.  You can also take Tylenol/Advil every 4-6 hours as needed.  Make sure you are trying to stay active as you can.  Please follow-up if this is not improving in the next several weeks or sooner if worsening including any numbness, weakness.   For your urinary symptoms: We will try stopping your water intake a little bit earlier the night to see if this makes a difference.  If this is not improving it, please come back and we can talk about starting medication for BPH, further labs etc.

## 2019-04-25 NOTE — Assessment & Plan Note (Signed)
Suspect associated with BPH given symptomatology, age, and mildly enlarged prostate on rectal exam.  Considered UTI, however less likely without any concerning suggestive symptoms and clear U/A. Reassuringly, had normal PSA in 07/2018, suggesting against prostate cancer.  Could consider bladder dysfunction, however lower suspicion for this as urinary frequency is his main complaint.  No concern for diabetes at this time.  Discussed trialing Flomax, however patient would like to start with limiting his water intake at night first to see if this makes improvements. - Continue water intake throughout the day, stop after dinner - Follow-up if not improving in several weeks or sooner if worsening, dysuria, abdominal pains, fever, polydipsia/fatigue

## 2019-08-23 ENCOUNTER — Ambulatory Visit: Payer: BC Managed Care – PPO | Admitting: Family Medicine

## 2019-08-23 ENCOUNTER — Other Ambulatory Visit: Payer: Self-pay

## 2019-08-23 ENCOUNTER — Encounter: Payer: Self-pay | Admitting: Family Medicine

## 2019-08-23 VITALS — BP 104/70 | HR 74 | Wt 195.0 lb

## 2019-08-23 DIAGNOSIS — Z0000 Encounter for general adult medical examination without abnormal findings: Secondary | ICD-10-CM | POA: Diagnosis not present

## 2019-08-23 DIAGNOSIS — E785 Hyperlipidemia, unspecified: Secondary | ICD-10-CM

## 2019-08-23 DIAGNOSIS — M25551 Pain in right hip: Secondary | ICD-10-CM

## 2019-08-23 DIAGNOSIS — I10 Essential (primary) hypertension: Secondary | ICD-10-CM | POA: Diagnosis not present

## 2019-08-23 DIAGNOSIS — Z299 Encounter for prophylactic measures, unspecified: Secondary | ICD-10-CM | POA: Diagnosis not present

## 2019-08-23 NOTE — Patient Instructions (Signed)
Great to see you! If the hip does not get better, or if It gets worse, please let me know.

## 2019-08-24 LAB — COMPREHENSIVE METABOLIC PANEL
ALT: 15 IU/L (ref 0–44)
AST: 14 IU/L (ref 0–40)
Albumin/Globulin Ratio: 1.9 (ref 1.2–2.2)
Albumin: 4.4 g/dL (ref 3.8–4.8)
Alkaline Phosphatase: 109 IU/L (ref 39–117)
BUN/Creatinine Ratio: 11 (ref 10–24)
BUN: 11 mg/dL (ref 8–27)
Bilirubin Total: 0.4 mg/dL (ref 0.0–1.2)
CO2: 22 mmol/L (ref 20–29)
Calcium: 9.6 mg/dL (ref 8.6–10.2)
Chloride: 102 mmol/L (ref 96–106)
Creatinine, Ser: 0.98 mg/dL (ref 0.76–1.27)
GFR calc Af Amer: 95 mL/min/{1.73_m2} (ref >59)
GFR calc non Af Amer: 82 mL/min/{1.73_m2} (ref >59)
Globulin, Total: 2.3 g/dL (ref 1.5–4.5)
Glucose: 95 mg/dL (ref 65–99)
Potassium: 4.4 mmol/L (ref 3.5–5.2)
Sodium: 139 mmol/L (ref 134–144)
Total Protein: 6.7 g/dL (ref 6.0–8.5)

## 2019-08-24 LAB — PSA: Prostate Specific Ag, Serum: 0.6 ng/mL (ref 0.0–4.0)

## 2019-08-24 LAB — LIPID PANEL
Chol/HDL Ratio: 3.2 ratio (ref 0.0–5.0)
Cholesterol, Total: 123 mg/dL (ref 100–199)
HDL: 38 mg/dL — ABNORMAL LOW (ref >39)
LDL Chol Calc (NIH): 68 mg/dL (ref 0–99)
Triglycerides: 90 mg/dL (ref 0–149)
VLDL Cholesterol Cal: 17 mg/dL (ref 5–40)

## 2019-08-25 NOTE — Assessment & Plan Note (Signed)
Consistent with greater trochanteric bursitis.  Corticosteroid injection today.  If is not better he will let me know.

## 2019-08-25 NOTE — Progress Notes (Signed)
    CHIEF COMPLAINT / HPI: CPE Only complaint is of some right hip pain.  Seen for that here few weeks ago.  Not better.  Lateral hip.  Worse after long periods of sitting or if he drives for a long time.  Not keeping him awake at night   PERTINENT  PMH / PSH: I have reviewed the patient's medications, allergies, past medical and surgical history, smoking status and updated in the EMR as appropriate.   OBJECTIVE:  BP 104/70   Pulse 74   Wt 195 lb (88.5 kg)   SpO2 97%   BMI 25.73 kg/m  Vital signs reviewed GENERALl: Well developed, well nourished, in no acute distress. HEENT: PERRLA, EOMI, sclerae are nonicteric NECK: Supple, FROM, without lymphadenopathy.  THYROID: normal without nodularity CAROTID ARTERIES: without bruits LUNGS: clear to auscultation bilaterally. No wheezes or rales. Normal respiratory effort HEART: Regular rate and rhythm, no murmurs. Distal pulses are bilaterally symmetrical, 2+. ABDOMEN: soft with positive bowel sounds. No masses noted MSK: MOE x 4. Normal muscle strength, bulk and tone.  Mildly tender to palpation over the right greater trochanteric bursa of the right hip. SKIN no rash. Normal temperature. NEURO: no focal deficits. Normal gait. Normal balance.  PROCEDURE: INJECTION: Patient was given informed consent, signed copy in the chart. Appropriate time out was taken. Area prepped and draped in usual sterile fashion. Ethyl chloride was  used for local anesthesia. A 21 gauge 1 1/2 inch needle was used.. 1 cc of methylprednisolone 40 mg/ml plus 4 cc of 1% lidocaine without epinephrine was injected into the right hip greater trochanteric bursa using a(n) perpendicular approach.   The patient tolerated the procedure well. There were no complications. Post procedure instructions were given.  ASSESSMENT / PLAN:   No problem-specific Assessment & Plan notes found for this encounter.   Denny Levy MD

## 2019-08-25 NOTE — Assessment & Plan Note (Signed)
He is doing quite well.  Maintaining his activity and his weight.  No specific issues other than his hip.  Laboratory work is pending.  No medication changes.

## 2019-09-05 ENCOUNTER — Encounter: Payer: Self-pay | Admitting: Family Medicine

## 2019-09-25 ENCOUNTER — Other Ambulatory Visit: Payer: Self-pay

## 2019-09-26 ENCOUNTER — Other Ambulatory Visit: Payer: Self-pay | Admitting: *Deleted

## 2019-09-26 DIAGNOSIS — Z0000 Encounter for general adult medical examination without abnormal findings: Secondary | ICD-10-CM

## 2019-09-26 MED ORDER — TRIAMTERENE-HCTZ 37.5-25 MG PO CAPS: 1.0000 | ORAL_CAPSULE | Freq: Every day | ORAL | 3 refills | Status: DC

## 2019-09-28 ENCOUNTER — Ambulatory Visit: Payer: BC Managed Care – PPO | Attending: Internal Medicine

## 2019-09-28 DIAGNOSIS — Z23 Encounter for immunization: Secondary | ICD-10-CM | POA: Insufficient documentation

## 2019-09-28 NOTE — Progress Notes (Signed)
   Covid-19 Vaccination Clinic  Name:  Shawn Fitzpatrick    MRN: [id number] DOB: April 22, 1956  09/28/2019  Shawn Fitzpatrick was observed post Covid-19 immunization for 15 minutes without incident. He was provided with Vaccine Information Sheet and instruction to access the V-Safe system.   Shawn Fitzpatrick was instructed to call 911 with any severe reactions post vaccine: Marland Kitchen Difficulty breathing  . Swelling of face and throat  . A fast heartbeat  . A bad rash all over body  . Dizziness and weakness   Immunizations Administered    Name Date Dose VIS Date Route   Pfizer COVID-19 Vaccine 09/28/2019  5:33 PM 0.3 mL 07/07/2019 Intramuscular   Manufacturer: [employer], [employer]   Lot: [id number]   NDC: [address]-9

## 2019-09-28 NOTE — Telephone Encounter (Signed)
Patient calls nurse line to check status of refill.   Veronda Prude, RN

## 2019-09-29 MED ORDER — BUPROPION HCL ER (XL) 150 MG PO TB24: 150.0000 mg | ORAL_TABLET | Freq: Every day | ORAL | 3 refills | Status: DC

## 2019-09-29 MED ORDER — ATORVASTATIN CALCIUM 10 MG PO TABS: 10.0000 mg | ORAL_TABLET | Freq: Every day | ORAL | 3 refills | Status: DC

## 2019-10-09 ENCOUNTER — Ambulatory Visit: Payer: BC Managed Care – PPO | Admitting: Family Medicine

## 2019-10-09 ENCOUNTER — Ambulatory Visit
Admission: RE | Admit: 2019-10-09 | Discharge: 2019-10-09 | Disposition: A | Discharge status: Home / Self Care | Payer: BC Managed Care – PPO | Source: Ambulatory Visit | Attending: Family Medicine | Admitting: Family Medicine

## 2019-10-09 ENCOUNTER — Other Ambulatory Visit: Payer: Self-pay

## 2019-10-09 VITALS — BP 126/80 | HR 79 | Wt 196.2 lb

## 2019-10-09 DIAGNOSIS — M546 Pain in thoracic spine: Secondary | ICD-10-CM | POA: Diagnosis not present

## 2019-10-09 DIAGNOSIS — Z299 Encounter for prophylactic measures, unspecified: Secondary | ICD-10-CM

## 2019-10-09 DIAGNOSIS — M545 Low back pain, unspecified: Secondary | ICD-10-CM | POA: Insufficient documentation

## 2019-10-09 DIAGNOSIS — M549 Dorsalgia, unspecified: Secondary | ICD-10-CM | POA: Insufficient documentation

## 2019-10-09 LAB — POCT URINALYSIS DIP (MANUAL ENTRY)
Bilirubin, UA: NEGATIVE
Blood, UA: NEGATIVE
Glucose, UA: NEGATIVE mg/dL
Ketones, POC UA: NEGATIVE mg/dL
Leukocytes, UA: NEGATIVE
Nitrite, UA: NEGATIVE
Protein Ur, POC: NEGATIVE mg/dL
Spec Grav, UA: 1.02 (ref 1.010–1.025)
Urobilinogen, UA: 0.2 E.U./dL
pH, UA: 7 (ref 5.0–8.0)

## 2019-10-09 MED ORDER — TRAMADOL HCL 50 MG PO TABS: 50.0000 mg | ORAL_TABLET | Freq: Four times a day (QID) | ORAL | 0 refills | Status: AC | PRN

## 2019-10-09 NOTE — Progress Notes (Addendum)
    SUBJECTIVE:   CHIEF COMPLAINT / HPI:   Back pain Patient presenting for back pain x2 weeks.  Patient reports that he did not have any traumatic events leading up to this.  States he has tried heat, icy hot, Advil which do not help with the pain at all.  Pain is now interfering with work.  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  Has to lift himself up into a truck on occasion and is unable to do so because he cannot lift his body up with his left arm anymore.  Patient is left-handed.  Initially thought it was musculoskeletal related so try to avoid coming to the doctor.  Does report a large bruise in the area prior to coming in.  His wife noticed this.  Patient has pain with both rest and exertion.  Does help care for his 63 year old mother, does chores around the house for her.  No heavy lifting, pulling, pushing.  He states that when he was doing laundry 2 weeks ago he did turn and feels some pain but did not think much of it.  Denies any urinary symptoms.  Denies any hematuria.  Denies any shortness of breath.  Denies any GI symptoms.  Not on any blood thinners but does take 81 mg aspirin.  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  No history of trauma.  Differentials include renal pathology however no urinary symptoms or hematuria.  Consider renal colic, no nausea or vomiting associated.  No history of recent URI or any respiratory complaints.  Considered fracture however no trauma or hitting the area.  Will obtain x-Revin to rule this out.  Can consider musculoskeletal pain.   Can consider shingles however no rash prior or present.  Can consider GI pathology but no symptoms.  Unlikely splenic related.  If no improvement in 2 days will need to get CT imaging.  For the meantime we will get chest x-Jaremy as well as rib details.  Will also obtain UA to rule out renal pathology.  Follow-up in 2 weeks, sooner if worsening.  Strict return precautions given.  ED precautions given. Will give short course of tramadol. PDMP reviewed.   Preventive measure Patient reports he had colonoscopy done 2 years ago at [facility], will ask for release of records to update EMR    Discussed with Dr. Albin Felling, DO [facility]

## 2019-10-09 NOTE — Assessment & Plan Note (Addendum)
Patient with new onset back pain.  No history of trauma.  Differentials include renal pathology however no urinary symptoms or hematuria.  Consider renal colic, no nausea or vomiting associated.  No history of recent URI or any respiratory complaints.  Considered fracture however no trauma or hitting the area.  Will obtain x-Saabir to rule this out.  Can consider musculoskeletal pain.  Can consider shingles however no rash prior or present.  Can consider GI pathology but no symptoms.  Unlikely splenic related.  If no improvement in 2 days will need to get CT imaging.  For the meantime we will get chest x-Bon as well as rib details.  Will also obtain UA to rule out renal pathology.  Follow-up in 2 weeks, sooner if worsening.  Strict return precautions given.  ED precautions given. Will give short course of tramadol. PDMP reviewed.

## 2019-10-09 NOTE — Assessment & Plan Note (Signed)
Patient reports he had colonoscopy done 2 years ago at [facility], will ask for release of records to update EMR

## 2019-10-09 NOTE — Patient Instructions (Signed)
It was a pleasure seeing you today.   Today we discussed your   For your back pain: I will be getting xrays to rule out a fracture. I have given you a narcotic pain medication called tramadol. Do NOT take this medication before driving as this will make you drowsy. I recommend taking it before bed, or on a day you will be home all day. Follow up in 2 weeks to ensure improvement. You can continue heat as needed for pain and icy-hot.   Please follow up in 2 weeks or sooner if symptoms persist or worsen. Please call the clinic immediately if you have any concerns.   Our clinic's number is [phone]. Please call with questions or concerns.   Please go to the emergency room if you have worsening pain, fever, blood in your urine.   Thank you,  Oralia Manis, DO

## 2019-10-11 ENCOUNTER — Encounter: Payer: Self-pay | Admitting: Family Medicine

## 2019-10-11 ENCOUNTER — Telehealth: Payer: Self-pay | Admitting: Family Medicine

## 2019-10-11 NOTE — Telephone Encounter (Signed)
Called to check on patient.  He is still having significant back pain.  Reports tramadol is helping but it does "knock him out".  States that he knows that x-Nephi was negative but is still wondering what is causing pain.  I discussed with him the plan for CT imaging.  He does not want CT at this time.  Has a follow-up appointment on Friday with Dr. Jennette Kettle and would like to just discussed with her.  Was appreciative of call.  We will plan to follow-up with Dr. Jennette Kettle on Friday.  Orpah Clinton, PGY-3 [facility] 10/11/2019 11:56 AM

## 2019-10-13 ENCOUNTER — Ambulatory Visit (INDEPENDENT_AMBULATORY_CARE_PROVIDER_SITE_OTHER): Payer: BC Managed Care – PPO | Admitting: Family Medicine

## 2019-10-13 ENCOUNTER — Encounter: Payer: Self-pay | Admitting: Family Medicine

## 2019-10-13 ENCOUNTER — Other Ambulatory Visit: Payer: Self-pay

## 2019-10-13 VITALS — BP 120/76 | Ht 73.0 in | Wt 194.0 lb

## 2019-10-13 DIAGNOSIS — M546 Pain in thoracic spine: Secondary | ICD-10-CM | POA: Diagnosis not present

## 2019-10-13 MED ORDER — METHYLPREDNISOLONE ACETATE 40 MG/ML IJ SUSP
40.0000 mg | Freq: Once | INTRAMUSCULAR | Status: AC
  Administered 2019-10-13: 10:00:00 40 mg via INTRA_ARTICULAR

## 2019-10-13 MED ORDER — CYCLOBENZAPRINE HCL 5 MG PO TABS: 5.0000 mg | ORAL_TABLET | Freq: Two times a day (BID) | ORAL | 1 refills | Status: DC | PRN

## 2019-10-13 NOTE — Progress Notes (Signed)
[facility] Attending Note: I have seen and examined this patient with the medical student. I have discussed this patient with the r medical student and reviewed the assessment and plan as documented above. I agree with the medical students findings and plan. Patient is well-known to me as he is a member of my [employer], [employer]. He had excellent work-up when he was seen by Dr. Darin Engels. He has continued to have pain. On my exam today it does seem very localized and an area about 4 cm in diameter, localized over the ninth rib tip posteriorly. I can reproduce his pain exactly if I push there. There does not seem to be any deformity. He does not have any true radicular symptoms and that he has no more proximal tenderness. There is mild muscle spasm. I did a corticosteroid and lidocaine injection partly for diagnosis. After the injection, he had some relief of his pain. He was still having pain with torso rotation however.  Diagnosis is musculoskeletal pain likely from costochondritis of the ninth rib area posteriorly. We will write him out of work for next week. We will give him some muscle relaxers. I did do an injection today which was modestly helpful from a pain standpoint but more helpful from a diagnostic standpoint. I am going to give him some rotational exercises to do have him call me in a week. If he has worsening symptoms, he will let me know. I again consider all the pathology that would be possible including AAA, kidney issues including stones or renal cell carcinoma, shingles, lung pathology. None of these seem consistent with the findings I have on his exam today so hopefully this will resolve with our current plan.  PROCEDURE: INJECTION: Patient was given informed consent, signed copy in the chart. Appropriate time out was taken. Area prepped and draped in usual sterile fashion. Ethyl chloride was  used for local anesthesia. A 21 gauge 1 1/2 inch needle was used.. 1 cc of  methylprednisolone 40 mg/ml plus  2 cc of 1% lidocaine without epinephrine was injected into the soft tissue area of pain in the left flank (9th rib tip area)  using a(n) lateral approach.  Care was taken to remain within the soft tissue of the flank wall so the needle did not enter any viscous structures.  The patient tolerated the procedure well. There were no complications. Post procedure instructions were given.

## 2019-10-13 NOTE — Progress Notes (Signed)
PCP: Nestor Ramp, MD  Subjective:   HPI: Patient is a 64 y.o. male here for left sided back pain.   3/15 -Patient previously evaluated by Dr. Darin Engels (PCP) on 3/15 who obtained CXR which was negative for rib fracture and UA negative for renal pathology. Had low suspicion for fracture, GI pathology, splenic disease, shingles. Possible MSK etiolgoy and gave tramadol with instructions to follow up in 2 weeks.   Today Pain is located on the lateral back/left flank around the T9 intercostal area and is described as achy. 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 Pain is constant although fluctuates in intensity but is increased by lateral rotation/laying on affected side but is also noticeable at rest when driving/riding at job. Patient has taken this week off of work however no improvement was made. Patient has tried Advil, heat and ice without any relief. Patient was prescribed tramadol however didn't like the way it made him feel so has self-discontinued. Patient has previously had Shingles several years prior and states this pain is distinctly different and also has not developed a rash in this area over the past 3 weeks. Patient denies rashes elsewhere, recent fevers, viral illnesses, bowel/bladder incontinence.     Past Medical History:  Diagnosis Date  . Hyperlipidemia   . Hypertension     Current Outpatient Medications on File Prior to Visit  Medication Sig Dispense Refill  . AFLURIA PRESERVATIVE FREE 0.5 ML SUSY   0  . aspirin 81 MG EC tablet Take 81 mg by mouth daily.      Marland Kitchen atorvastatin (LIPITOR) 10 MG tablet Take 1 tablet (10 mg total) by mouth daily. 90 tablet 3  . buPROPion (WELLBUTRIN XL) 150 MG 24 hr tablet Take 1 tablet (150 mg total) by mouth daily. 90 tablet 3  . fexofenadine (ALLEGRA) 30 MG tablet Take 2 tablets (60 mg  total) by mouth 2 (two) times daily. 60 tablet 1  . fluticasone (FLONASE) 50 MCG/ACT nasal spray Place 2 sprays into both nostrils daily. 16 g 6  . meclizine (ANTIVERT) 25 MG tablet Take 1 tablet (25 mg total) by mouth 3 (three) times daily as needed for dizziness. 30 tablet 1  . omeprazole (PRILOSEC) 20 MG capsule TAKE 1 CAPSULE BY MOUTH EVERY DAY 90 capsule 1  . triamterene-hydrochlorothiazide (DYAZIDE) 37.5-25 MG capsule Take 1 each (1 capsule total) by mouth daily. 90 capsule 3   No current facility-administered medications on file prior to visit.    Past Surgical History:  Procedure Laterality Date  . ACROMIOPLASTY    . KNEE SURGERY    . VASECTOMY      No Known Allergies  Social History   Socioeconomic History  . Marital status: Married    Spouse name: Not on file  . Number of children: Not on file  . Years of education: Not on file  . Highest education level: Not on file  Occupational History  . Not on file  Tobacco Use  . Smoking status: Never Smoker  . Smokeless tobacco: Never Used  Substance and Sexual Activity  . Alcohol use: No  . Drug use: No  . Sexual activity: Yes    Comment: vasectomy  Other Topics Concern  . Not on file  Social History Narrative  . Not on file   Social Determinants of Health   Financial  Resource Strain:   . Difficulty of Paying Living Expenses:   Food Insecurity:   . Worried About Charity fundraiser in the Last Year:   . Arboriculturist in the Last Year:   Transportation Needs:   . Film/video editor (Medical):   Marland Kitchen Lack of Transportation (Non-Medical):   Physical Activity:   . Days of Exercise per Week:   . Minutes of Exercise per Session:   Stress:   . Feeling of Stress :   Social Connections:   . Frequency of Communication with Friends and Family:   . Frequency of Social Gatherings with Friends and Family:   . Attends Religious Services:   . Active Member of Clubs or Organizations:   . Attends Archivist  Meetings:   Marland Kitchen Marital Status:   Intimate Partner Violence:   . Fear of Current or Ex-Partner:   . Emotionally Abused:   Marland Kitchen Physically Abused:   . Sexually Abused:     Family History  Problem Relation Age of Onset  . Stroke Father 14  . Cancer Father 33       prostate cancer    BP 120/76   Ht 6\' 1"  (1.854 m)   Wt 194 lb (88 kg)   BMI 25.60 kg/m   Review of Systems: See HPI above.     Objective:  Physical Exam:  General: Alert and oriented x3, NAD, comfortable in exam room HEENT: normocephalic and atraumatic, EOMI, conjunctivae and sclerae clear, MMM, no erythema or exudates, trachea midline,  Left Back/Flank Inspection: no swelling, no scarring, no overlying bruising or breaks in the skin, no obvious bony abnormalities Palpation: focally TTP over the left back/left flank over a spot located over the left paraspinal muscle around the T9 intercostal area, notable paraspinal spasm compared to contralateral side, no reproducible pain with palpation over remaining back muscles ROM (active): complete flexion and extension, restricted rotation to right and left due to pain (20 degrees in each direction)   I have personally reviewed the patient's CXR and rib xrays which appear negative for pathology including rib fractures, bony masses, and clear air fields, and normal cardiac silhouette and mediastinum without any appreciable masses.    Assessment & Plan:   Shawn Fitzpatrick is a 64 y.o. male presenting with 3 weeks of focal left sided back/flank pain on the lateral portion of the paraspinal muscles that has not improved with NSAIDS, heat, rest. Xrays negative for rib fracture and UA negative for renal disease. Exam with focal pain and history of lifting consistent with possible MSK injury however 3 weeks unusual for muscle strain or costochondritis. Will attempt MSK treatment to see if it resolves pain but will follow closely if not resolving for other disease etiology.   1. Left  Back/Flank Pain -- 1:1 trigger point injection in paraspinal muscle as described below -- rotational home PT exercises -- flexeril to relieve muscle tension -- phone follow up in 1 week if not improved, consider chest/abdominal CT     Carolyne Littles, MS4

## 2019-10-25 ENCOUNTER — Ambulatory Visit: Payer: BC Managed Care – PPO | Attending: Internal Medicine

## 2019-10-25 DIAGNOSIS — Z23 Encounter for immunization: Secondary | ICD-10-CM

## 2019-10-25 NOTE — Progress Notes (Signed)
   Covid-19 Vaccination Clinic  Name:  KAYN HAYMORE    MRN: [id number] DOB: [birth year]  10/25/2019  Mr. Koeller was observed post Covid-19 immunization for 15 minutes without incident. He was provided with Vaccine Information Sheet and instruction to access the V-Safe system.   Mr. Vanriper was instructed to call 911 with any severe reactions post vaccine: Marland Kitchen Difficulty breathing  . Swelling of face and throat  . A fast heartbeat  . A bad rash all over body  . Dizziness and weakness   Immunizations Administered    Name Date Dose VIS Date Route   Pfizer COVID-19 Vaccine 10/25/2019  8:40 AM 0.3 mL 07/07/2019 Intramuscular   Manufacturer: [employer], [employer]   Lot: [id number]   NDC: [address]-5

## 2020-03-13 ENCOUNTER — Other Ambulatory Visit: Payer: Self-pay

## 2020-03-13 ENCOUNTER — Ambulatory Visit: Payer: BC Managed Care – PPO | Admitting: Family Medicine

## 2020-03-13 DIAGNOSIS — S39012A Strain of muscle, fascia and tendon of lower back, initial encounter: Secondary | ICD-10-CM | POA: Diagnosis not present

## 2020-03-13 NOTE — Progress Notes (Signed)
    SUBJECTIVE:   CHIEF COMPLAINT / HPI:   Back pain Patient is a 64 year old male that presents with new back strain. Per chart review patient was evaluated on 3/19 for lateral back and left flank pain around the area of T9 intercostal region.  At that time patient complained that the pain started about 3 weeks ago when moving boxes at his mother's home.  During that visit the patient received Flexeril and physical therapy exercises with a plan to follow-up in 1 week if not improved.  Patient also received a lidocaine and methylprednisolone injection in the left flank around the ninth rib tip due to complaints of MSK chest pain, most likely costochondritis and low back pain.   Patient states that the symptoms improved.  However yesterday he was moving boxes at his mother's house and reinjured it. Mostly on left side. No sciatica. 6 out of 10 when sitting, 9/10 when active. Been using advil. Tried some flexeril that he had left over which helped. 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 No bowel or bladder incontentience, no saddle parastesias.   PERTINENT  PMH / PSH: Previous lower back strain on the other side approximately 1 year ago after an injury.  OBJECTIVE:   BP 112/72   Pulse 75   Ht 6\' 1"  (1.854 m)   Wt 195 lb 12.8 oz (88.8 kg)   SpO2 97%   BMI 25.83 kg/m    General: NAD, pleasant, able to participate in exam. Respiratory: No respiratory distress MSK: No pinpoint tenderness to palpation of the vertebra.  Hypertonicity noted of the left lower musculature lateral to the region of L3/L4.  Negative straight leg test bilaterally. Skin: warm and dry, no rashes noted Neuro: alert, no obvious focal deficits Psych: Normal affect and mood  ASSESSMENT/PLAN:   Low back strain Assessment: Patient with new low back strain.  No saddle paresthesias, loss of bowel or bladder function.  Patient has been experiencing this  symptom for 1 day.  As using Flexeril with benefit but cannot work while using Flexeril and works as a for [employer] on Thursdays and Fridays.  Patient requesting work note to keep him out for this week. Plan: -Recommend patient continue taking Flexeril which he states he has plenty of from his previous back strain -Can use anti-inflammatories as needed -We will write patient out of work for 1 week with plan to follow-up next week if he still has pain.     12-14-1989, DO [facility]

## 2020-03-13 NOTE — Assessment & Plan Note (Signed)
Assessment: Patient with new low back strain.  No saddle paresthesias, loss of bowel or bladder function.  Patient has been experiencing this symptom for 1 day.  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  Patient requesting work note to keep him out for this week. Plan: -Recommend patient continue taking Flexeril which he states he has plenty of from his previous back strain -Can use anti-inflammatories as needed -We will write patient out of work for 1 week with plan to follow-up next week if he still has pain.

## 2020-03-13 NOTE — Patient Instructions (Signed)
It was great to see you!  Our plans for today:  -Today we discussed your low back strain.  Per our discussion I recommend that you continue the Flexeril for the next few days to help with your pain.  You can also continue anti-inflammatories as you have done in the past.  I write you out of work for the next 1 week.  If you feel that you are still having pain in a.m. able to go back to work by next Thursday please make a follow-up appointment.  If your back pain does not improve in the next 1 week or worsens please call us to schedule a follow-up appointment. -Should you develop any loss of bowel or bladder function, or numbness in your genital region please go to the emergency department.  Take care and seek immediate care sooner if you develop any concerns.   Dr. [facility]

## 2020-04-10 IMAGING — CR DG CHEST 2V
2 series · 2 of 2 positions shown · non-contrast
Comparison: None.

CLINICAL DATA: Left-sided back pain for 2 weeks without known
injury.

EXAM:
CHEST - 2 VIEW

[w chest pa]
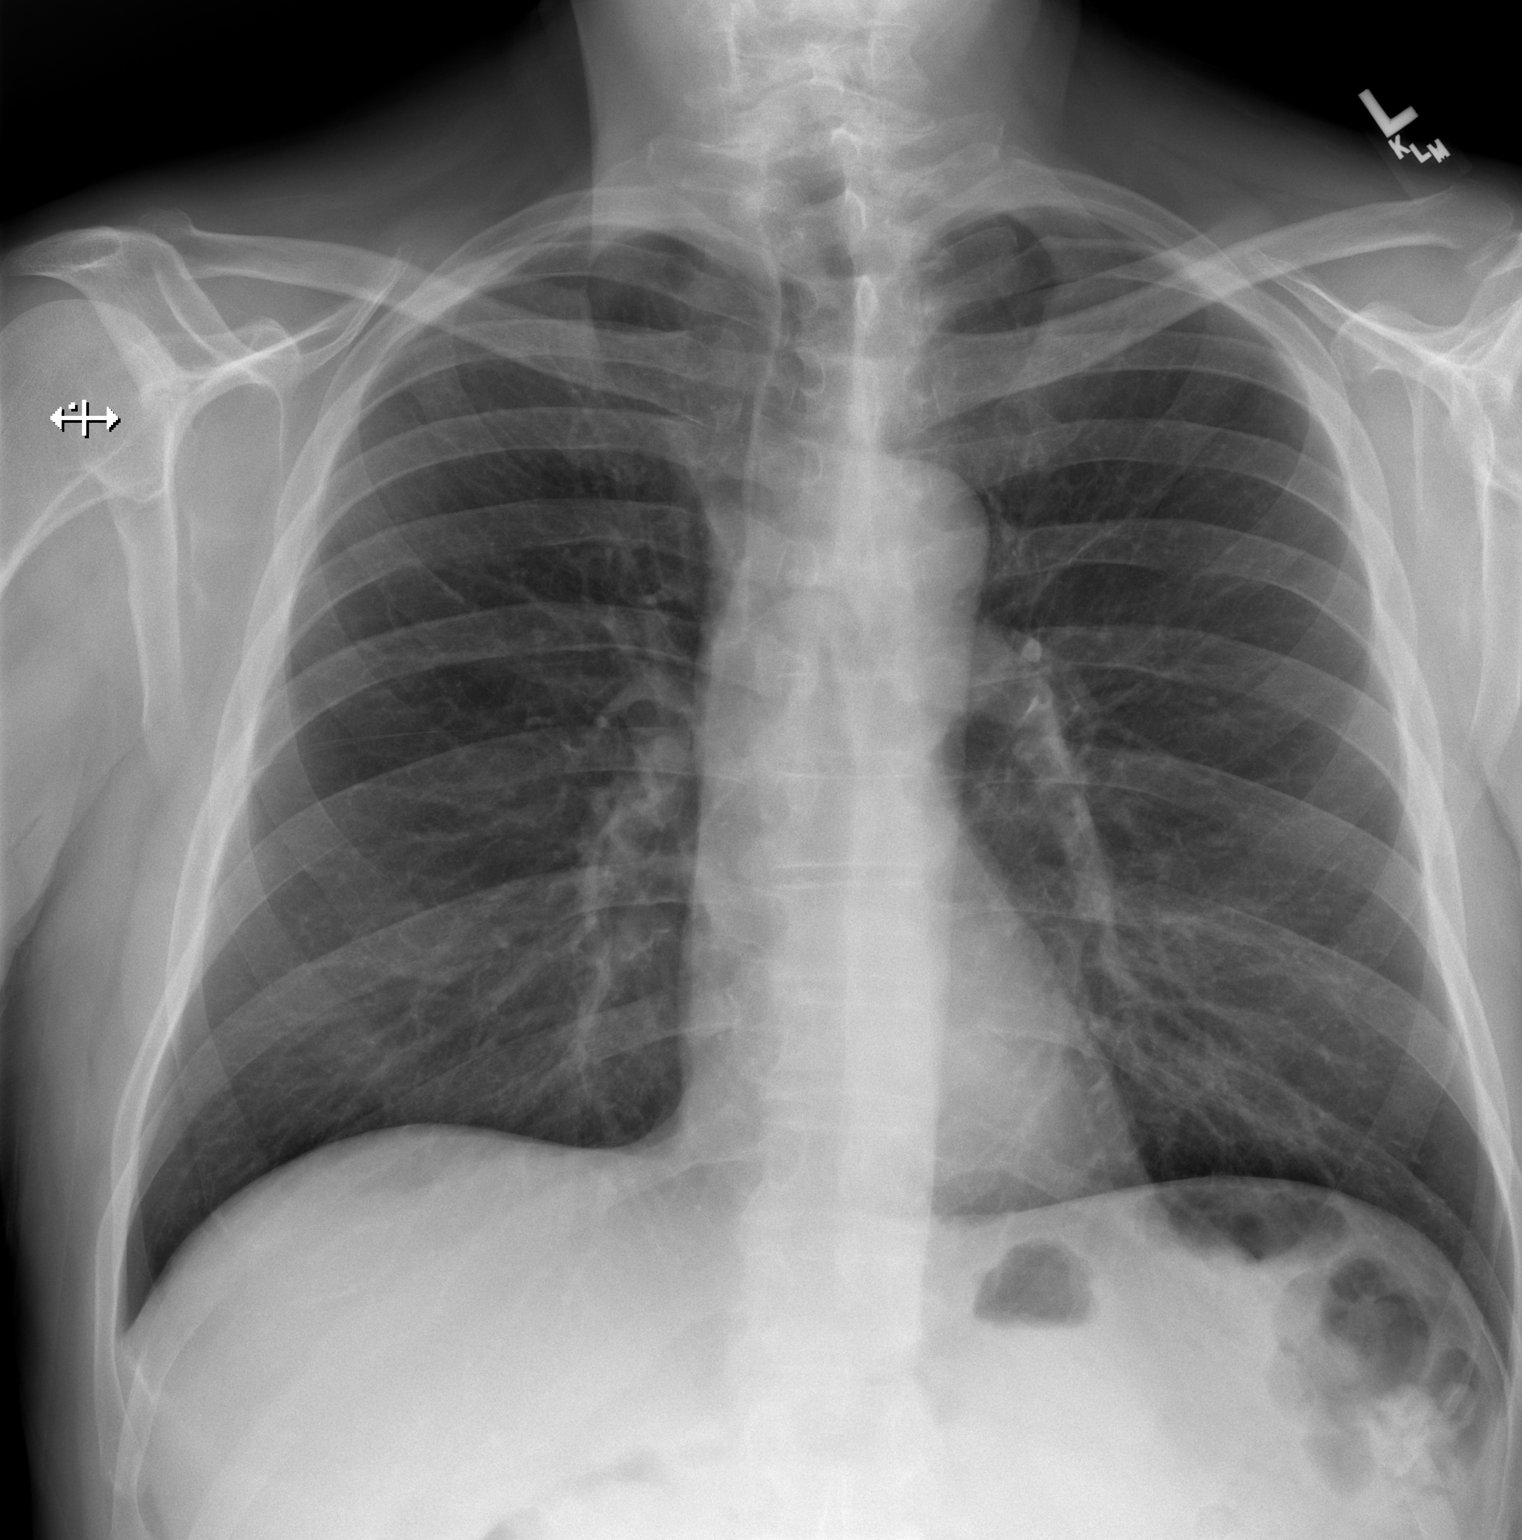

[w chest lat]
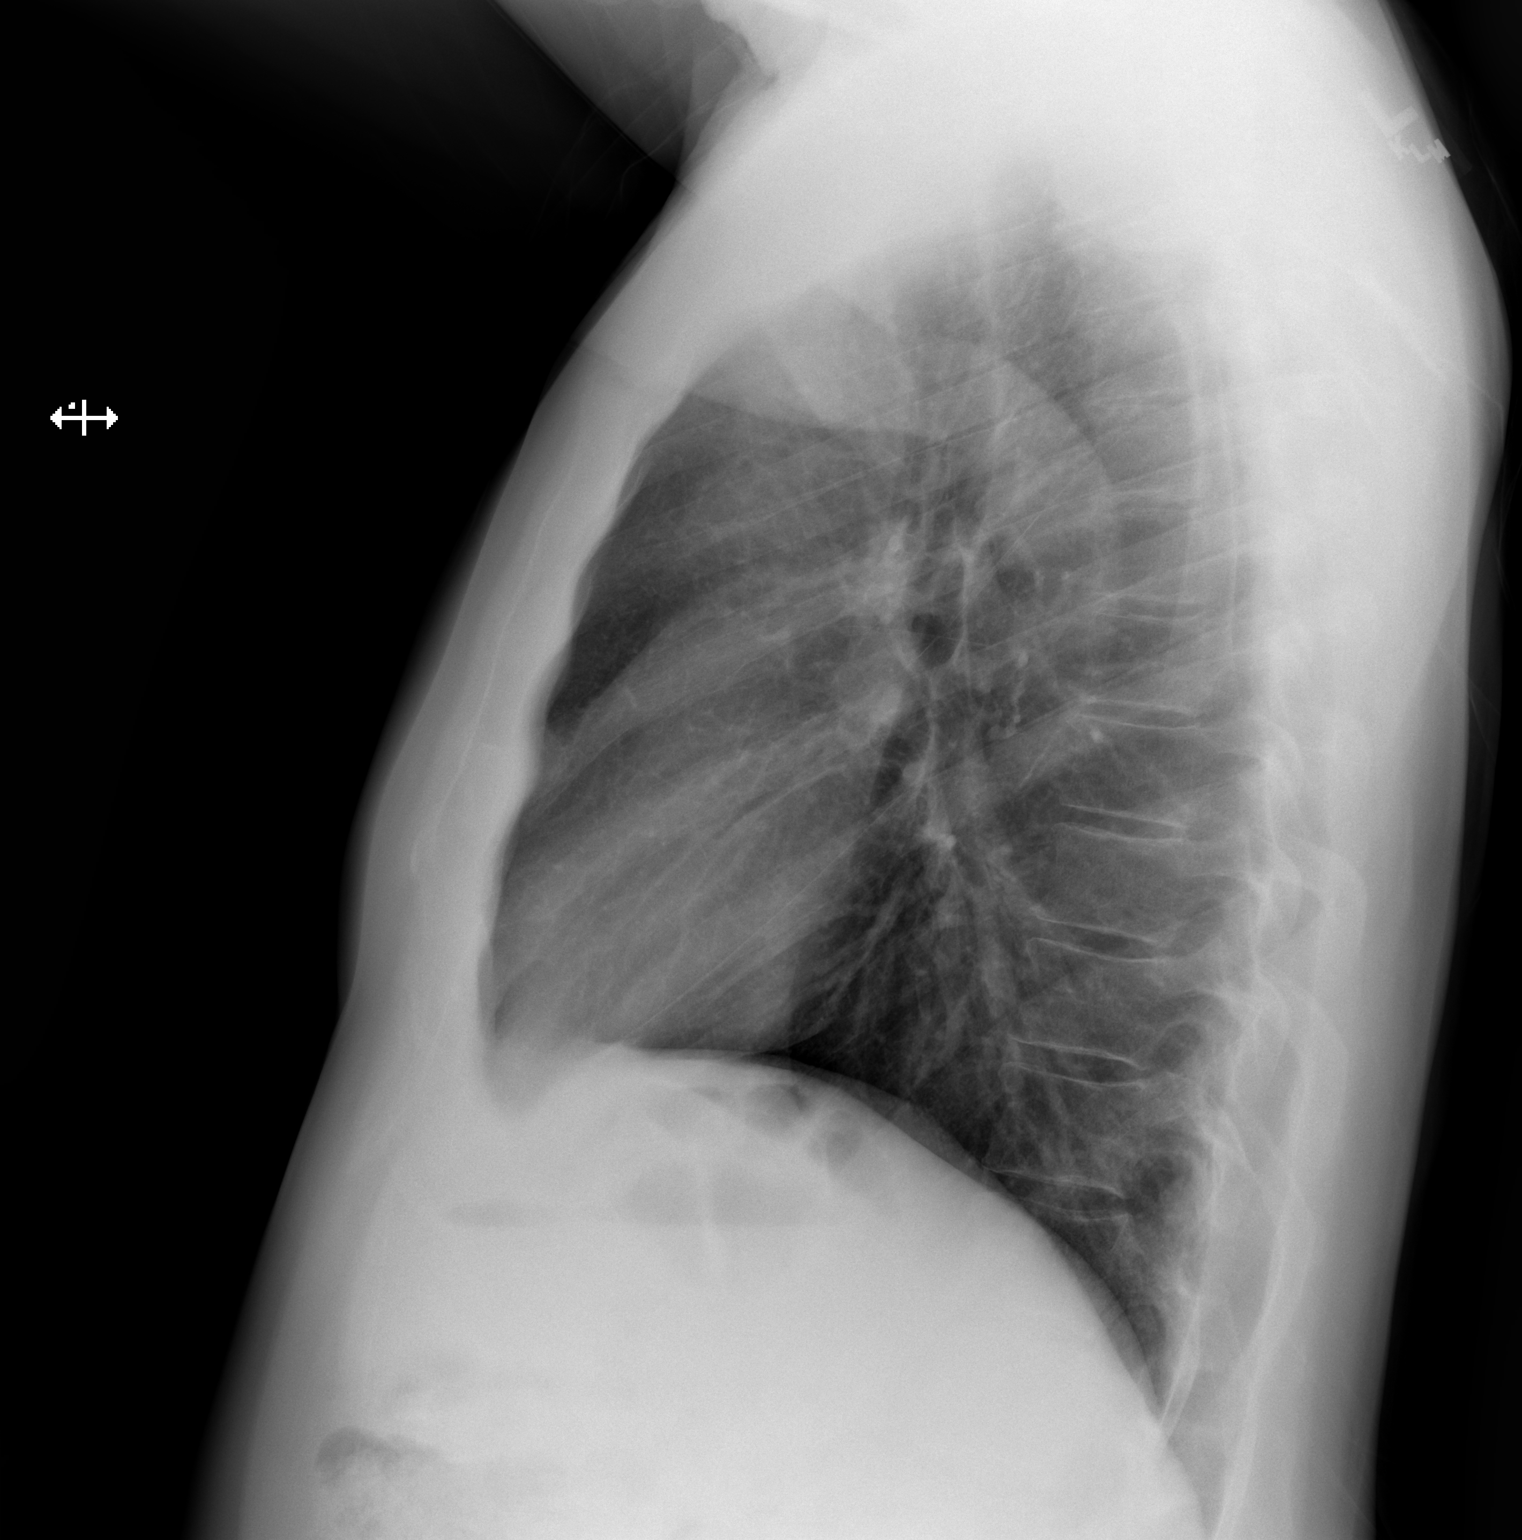

[2 of 2 positions shown; findings below may reference images not displayed]

FINDINGS: The heart size and mediastinal contours are within normal limits.
Both lungs are clear. No pneumothorax or pleural effusion is noted.
The visualized skeletal structures are unremarkable.
IMPRESSION: No active cardiopulmonary disease.

## 2020-05-07 ENCOUNTER — Other Ambulatory Visit: Payer: Self-pay

## 2020-05-07 ENCOUNTER — Encounter: Payer: Self-pay | Admitting: Family Medicine

## 2020-05-07 ENCOUNTER — Ambulatory Visit: Payer: BC Managed Care – PPO | Admitting: Family Medicine

## 2020-05-07 DIAGNOSIS — K2970 Gastritis, unspecified, without bleeding: Secondary | ICD-10-CM

## 2020-05-07 DIAGNOSIS — K219 Gastro-esophageal reflux disease without esophagitis: Secondary | ICD-10-CM | POA: Diagnosis not present

## 2020-05-07 DIAGNOSIS — S39012D Strain of muscle, fascia and tendon of lower back, subsequent encounter: Secondary | ICD-10-CM | POA: Diagnosis not present

## 2020-05-07 MED ORDER — OMEPRAZOLE 20 MG PO CPDR: DELAYED_RELEASE_CAPSULE | ORAL | 1 refills | Status: DC

## 2020-05-07 NOTE — Assessment & Plan Note (Signed)
Worsened.  No red flags for obstruction and no CAD symptoms.  Trial of increased PPI and monitor

## 2020-05-07 NOTE — Progress Notes (Signed)
    SUBJECTIVE:   CHIEF COMPLAINT / HPI:   ABDOMINAL CHEST PRESSURE Has feeling he needs to burp but can't.  Has feeling of mild reflux with taste in his mouth  Has had these feeling before that improved with PPI.  Taking omeprazole once daily but thinks it might be expired.  No chest pain or shortness of breath with exertion.  No swalloign problems or vomiting or bleeding or unusual weight loss  BACK PAIN Feels his strained his left back getting in and out of cars at work.  Had similar thing in August.  He takes muscle relaxers which helps.  Would like a work excuse like he had in August.  No weakness or incontinence   PERTINENT  PMH / PSH: works at [employer]  OBJECTIVE:   BP 114/72   Pulse 85   Wt 192 lb 9.6 oz (87.4 kg)   SpO2 98%   BMI 25.41 kg/m   Heart - Regular rate and rhythm.  No murmurs, gallops or rubs.    Lungs:  Normal respiratory effort, chest expands symmetrically. Lungs are clear to auscultation, no crackles or wheezes. Abdomen: soft and non-tender without masses, organomegaly or hernias noted.  No guarding or rebound Back - mildly tender L thoracic paraspinous muscles.  No deformity.  FROM with bending and twisting with mild pain.  Able to walk on heels and toes   ASSESSMENT/PLAN:   Low back strain Worsened.  Seems consistent with musculoskeletal strain.  Continue muscle relaxers and work restrictions (not being out) letter given.  Cautioned that he needs to be physically active once this improves to help prevent recurrences or may need Physical Therapy   GERD (gastroesophageal reflux disease) Worsened.  No red flags for obstruction and no CAD symptoms.  Trial of increased PPI and monitor      Carney Living, MD [facility]

## 2020-05-07 NOTE — Assessment & Plan Note (Signed)
Worsened.  Seems consistent with musculoskeletal strain.  Continue muscle relaxers and work restrictions (not being out) letter given.  Cautioned that he needs to be physically active once this improves to help prevent recurrences or may need Physical Therapy

## 2020-05-07 NOTE — Patient Instructions (Addendum)
Good to see you today!  Thanks for coming in.  Try prilosec twice a day for one week then go back to once a day.  If not feeling better in 2 weeks or if any worsening then come back  For the back use the flexaril and ibuprofen as needed If worsening or not better in one week then come back  Once the back is better start a regular exercise Regular walking and stretching

## 2020-05-11 ENCOUNTER — Ambulatory Visit: Payer: BC Managed Care – PPO | Attending: Internal Medicine

## 2020-05-11 DIAGNOSIS — Z23 Encounter for immunization: Secondary | ICD-10-CM

## 2020-05-11 NOTE — Progress Notes (Signed)
   Covid-19 Vaccination Clinic  Name:  Shawn Fitzpatrick    MRN: [id number] DOB: 04-Oct-1955  05/11/2020  [name] was observed post Covid-19 immunization for 15 minutes without incident. He was provided with Vaccine Information Sheet and instruction to access the V-Safe system.   [name] was instructed to call 911 with any severe reactions post vaccine: Marland Kitchen Difficulty breathing  . Swelling of face and throat  . A fast heartbeat  . A bad rash all over body  . Dizziness and weakness

## 2020-05-15 ENCOUNTER — Other Ambulatory Visit: Payer: Self-pay

## 2020-05-15 ENCOUNTER — Encounter: Payer: Self-pay | Admitting: Family Medicine

## 2020-05-15 ENCOUNTER — Ambulatory Visit: Payer: BC Managed Care – PPO | Admitting: Family Medicine

## 2020-05-15 VITALS — BP 110/64 | HR 75 | Ht 73.0 in | Wt 192.6 lb

## 2020-05-15 DIAGNOSIS — Z658 Other specified problems related to psychosocial circumstances: Secondary | ICD-10-CM | POA: Diagnosis not present

## 2020-05-15 DIAGNOSIS — K2289 Other specified disease of esophagus: Secondary | ICD-10-CM | POA: Insufficient documentation

## 2020-05-15 NOTE — Progress Notes (Signed)
    CHIEF COMPLAINT / HPI: He is here for follow-up of his several week history of esophageal pain.  He describes it as feeling of really needing to burp but inability to do that.  He is able to burp small amounts at a time eventually if he really focuses on that and that seems to relieve his pain.  The pain is in the anterior chest.  It does not radiate down the arm.  It is worse after he eats.  It is not associated with activity.  He has had no shortness of breath and no change in his exercise tolerance.  We had increased his proton pump inhibitor therapy at last office visit and he says that may have helped about 10% but he is not relieved.  He has had no weight loss, no true heartburn symptoms, no change in bowel or bladder habits, no blood in stool.    Otherwise he feels generally well.    He is having some increased stressors with the care of his aging mother and plans to quit his part-time job in the near future so he can spend more time caring for her.   PERTINENT  PMH / PSH: I have reviewed the patient's medications, allergies, past medical and surgical history, smoking status and updated in the EMR as appropriate.   OBJECTIVE:  BP 110/64   Pulse 75   Ht 6\' 1"  (1.854 m)   Wt 192 lb 9.6 oz (87.4 kg)   SpO2 98%   BMI 25.41 kg/m  GENERAL: Well-developed male no acute distress CHEST: Nontender to palpation.  No defect.  No supraclavicular nodes.   Neck is without any lymphadenopathy, no JVD. ABDOMEN: Soft, positive bowel sounds, nontender nondistended no rebound or guarding.  No right upper quadrant tenderness. CV: Regular rate and rhythm BACK: No defects.  Vertebra nontender to percussion.  ASSESSMENT / PLAN:   Esophageal pain Seems very consistent with esophageal spasm or some type of esophageal pain.  I would continue the increased proton pump inhibitor but I have placed an urgent referral to his gastroenterologist.  Hopefully they can see him soon.  If he does not hear from  them the next week, he is to let me know.  Alternatively, he can just call their office and see if they can get him in sooner.  There is nothing here that makes me think this is cardiac.  If he has new or worsening symptoms, he will let me know.  I would like to see him back in about a month to make sure we have close the loop on this.  Psychosocial stressors He is going to drop his part-time job in the near future so he can spend more time caring for his mother.  This has been quite a stress for him for some time.  Overall he seems to be dealing with quite well.  He will let me know if he needs follow-up regarding this issue.  He has very supportive wife and  good home situation.   MD

## 2020-05-15 NOTE — Assessment & Plan Note (Addendum)
Seems very consistent with esophageal spasm or some type of esophageal pain.  I would continue the increased proton pump inhibitor but I have placed an urgent referral to his gastroenterologist.  Hopefully they can see him soon.  If he does not hear from them the next week, he is to let me know.  Alternatively, he can just call their office and see if they can get him in sooner.  There is nothing here that makes me think this is cardiac.  If he has new or worsening symptoms, he will let me know.  I would like to see him back in about a month to make sure we have close the loop on this. He said he had been doing some Internet searching in had of course seen some dreadful things such as esophageal cancer.  I told him I do not think that is what his symptoms are indicating.  Is much more consistent with esophageal spasm.  He seemed reassured by this.

## 2020-05-15 NOTE — Assessment & Plan Note (Addendum)
He is going to drop his part-time job in the near future so he can spend more time caring for his mother.  This has been quite a stress for him for some time.  Overall he seems to be dealing with quite well.  He will let me know if he needs follow-up regarding this issue.  He has very supportive wife and  good home situation.

## 2020-09-15 ENCOUNTER — Other Ambulatory Visit: Payer: Self-pay | Admitting: Family Medicine

## 2020-09-15 DIAGNOSIS — Z0000 Encounter for general adult medical examination without abnormal findings: Secondary | ICD-10-CM

## 2020-09-18 ENCOUNTER — Encounter: Payer: BC Managed Care – PPO | Admitting: Family Medicine

## 2020-09-20 ENCOUNTER — Encounter: Payer: Self-pay | Admitting: Family Medicine

## 2020-09-20 ENCOUNTER — Other Ambulatory Visit: Payer: Self-pay

## 2020-09-20 ENCOUNTER — Ambulatory Visit (INDEPENDENT_AMBULATORY_CARE_PROVIDER_SITE_OTHER): Payer: BC Managed Care – PPO | Admitting: Family Medicine

## 2020-09-20 VITALS — BP 108/72 | HR 67 | Ht 73.0 in | Wt 193.2 lb

## 2020-09-20 DIAGNOSIS — Z0000 Encounter for general adult medical examination without abnormal findings: Secondary | ICD-10-CM

## 2020-09-20 DIAGNOSIS — Z299 Encounter for prophylactic measures, unspecified: Secondary | ICD-10-CM | POA: Diagnosis not present

## 2020-09-20 DIAGNOSIS — I10 Essential (primary) hypertension: Secondary | ICD-10-CM | POA: Diagnosis not present

## 2020-09-20 NOTE — Patient Instructions (Signed)
Lets watch  the tremor and call me if it gets worse.  I will send you a note about your labs. Great to see you!

## 2020-09-21 LAB — COMPREHENSIVE METABOLIC PANEL
ALT: 23 IU/L (ref 0–44)
AST: 21 IU/L (ref 0–40)
Albumin/Globulin Ratio: 1.7 (ref 1.2–2.2)
Albumin: 4.2 g/dL (ref 3.8–4.8)
Alkaline Phosphatase: 120 IU/L (ref 44–121)
BUN/Creatinine Ratio: 13 (ref 10–24)
BUN: 14 mg/dL (ref 8–27)
Bilirubin Total: 0.3 mg/dL (ref 0.0–1.2)
CO2: 24 mmol/L (ref 20–29)
Calcium: 9.3 mg/dL (ref 8.6–10.2)
Chloride: 99 mmol/L (ref 96–106)
Creatinine, Ser: 1.05 mg/dL (ref 0.76–1.27)
GFR calc Af Amer: 87 mL/min/{1.73_m2} (ref >59)
GFR calc non Af Amer: 75 mL/min/{1.73_m2} (ref >59)
Globulin, Total: 2.5 g/dL (ref 1.5–4.5)
Glucose: 91 mg/dL (ref 65–99)
Potassium: 3.7 mmol/L (ref 3.5–5.2)
Sodium: 139 mmol/L (ref 134–144)
Total Protein: 6.7 g/dL (ref 6.0–8.5)

## 2020-09-21 LAB — LIPID PANEL
Chol/HDL Ratio: 3.5 ratio (ref 0.0–5.0)
Cholesterol, Total: 134 mg/dL (ref 100–199)
HDL: 38 mg/dL — ABNORMAL LOW (ref >39)
LDL Chol Calc (NIH): 75 mg/dL (ref 0–99)
Triglycerides: 113 mg/dL (ref 0–149)
VLDL Cholesterol Cal: 21 mg/dL (ref 5–40)

## 2020-09-21 LAB — PSA: Prostate Specific Ag, Serum: 0.7 ng/mL (ref 0.0–4.0)

## 2020-09-22 ENCOUNTER — Encounter: Payer: Self-pay | Admitting: Family Medicine

## 2020-09-22 NOTE — Progress Notes (Signed)
    CHIEF COMPLAINT / HPI:  Well adult check up Doing well Still caring for his aging Mom and that is somewhat stressful.  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  Energy is good.   PERTINENT  PMH / PSH: I have reviewed the patient's medications, allergies, past medical and surgical history, smoking status and updated in the EMR as appropriate. Had colonoscopy at [facility] 2019. OBJECTIVE:  BP 108/72   Pulse 67   Ht 6\' 1"  (1.854 m)   Wt 193 lb 3.2 oz (87.6 kg)   SpO2 98%   BMI 25.49 kg/m  GEN: WD WN NAD CV: RRR without murmur LUNGS: CTA bilaterally with breath sounds heard in all lung fields. No rales or wheezes ABDOMEN: soft, positive bowel sounds. EXTREMITY: MOE ax 4, no edema  NEURO: no gross focal deficit is noted PSYCH: AxOx4. Good eye contact.. No psychomotor retardation or agitation. Appropriate speech fluency and content. Asks and answers questions appropriately. Mood is congruent.   ASSESSMENT / PLAN:   Well adult exam Health maintenance UTD Chronic issues stable and reviewed Continue current meds labs   MD

## 2020-09-22 NOTE — Assessment & Plan Note (Signed)
Health maintenance UTD Chronic issues stable and reviewed Continue current meds labs

## 2020-09-24 ENCOUNTER — Encounter: Payer: Self-pay | Admitting: Family Medicine

## 2020-10-28 ENCOUNTER — Other Ambulatory Visit: Payer: Self-pay | Admitting: Family Medicine

## 2020-10-28 DIAGNOSIS — K2970 Gastritis, unspecified, without bleeding: Secondary | ICD-10-CM

## 2021-03-23 ENCOUNTER — Encounter: Payer: Self-pay | Admitting: Family Medicine

## 2021-07-04 ENCOUNTER — Ambulatory Visit
Admission: RE | Admit: 2021-07-04 | Discharge: 2021-07-04 | Disposition: A | Discharge status: Home / Self Care | Payer: BC Managed Care – PPO | Source: Ambulatory Visit | Attending: Emergency Medicine | Admitting: Emergency Medicine

## 2021-07-04 ENCOUNTER — Other Ambulatory Visit: Payer: Self-pay

## 2021-07-04 VITALS — BP 128/85 | HR 80 | Temp 98.8°F | Resp 8

## 2021-07-04 DIAGNOSIS — J209 Acute bronchitis, unspecified: Secondary | ICD-10-CM | POA: Diagnosis not present

## 2021-07-04 DIAGNOSIS — R062 Wheezing: Secondary | ICD-10-CM

## 2021-07-04 DIAGNOSIS — J0100 Acute maxillary sinusitis, unspecified: Secondary | ICD-10-CM

## 2021-07-04 DIAGNOSIS — R0989 Other specified symptoms and signs involving the circulatory and respiratory systems: Secondary | ICD-10-CM

## 2021-07-04 DIAGNOSIS — J0110 Acute frontal sinusitis, unspecified: Secondary | ICD-10-CM

## 2021-07-04 MED ORDER — AMOXICILLIN-POT CLAVULANATE 875-125 MG PO TABS: 1.0000 | ORAL_TABLET | Freq: Two times a day (BID) | ORAL | 0 refills | Status: AC

## 2021-07-04 MED ORDER — METHYLPREDNISOLONE SODIUM SUCC 125 MG IJ SOLR
125.0000 mg | Freq: Once | INTRAMUSCULAR | Status: AC
  Administered 2021-07-04: 125 mg via INTRAMUSCULAR

## 2021-07-04 MED ORDER — PROMETHAZINE-DM 6.25-15 MG/5ML PO SYRP: 5.0000 mL | ORAL_SOLUTION | Freq: Four times a day (QID) | ORAL | 0 refills | Status: DC | PRN

## 2021-07-04 MED ORDER — METHYLPREDNISOLONE 4 MG PO TBPK: ORAL_TABLET | ORAL | 0 refills | Status: DC

## 2021-07-04 NOTE — ED Provider Notes (Signed)
[facility]    CSN: [id number] Arrival date & time: 07/04/21  1201    HISTORY  No chief complaint on file.  HPI Shawn Fitzpatrick is a 65 y.o. male. Patient reports having a cold for about a week, states he is tried "every over-the-counter medication available", states that this does include Mucinex, to relieve her symptoms without success.  Patient states that currently he is having significant amount of pressure in his maxillary and frontal sinuses and both ears.  Patient denies loss of hearing.  Patient states he is also had a rough chesty cough, feels like he needs to cough something up but it will not come out.  Of note, respirations today are 18, not 8.  Patient states he has had bronchitis in the past, states he has never been prescribed inhalers in the past for bronchitis, states he feels he may have bronchitis now.  Patient adds that his chief concern actually is his sinus pain and pressure.  Denies pain radiating to his teeth.  Denies purulent nasal drainage.  The history is provided by the patient.  Past Medical History:  Diagnosis Date   Hyperlipidemia    Hypertension    Patient Active Problem List   Diagnosis Date Noted   Psychosocial stressors 05/15/2020   Low back strain 03/13/2020   Well adult exam 10/15/2010   SUPERIOR GLENOID LABRUM LESIONS 07/07/2010   LIBIDO, DECREASED 10/09/2009   MEDIAL MENISCUS TEAR, LEFT 05/06/2009   Hyperlipidemia 09/21/2007   Essential hypertension, benign 09/21/2007   Past Surgical History:  Procedure Laterality Date   ACROMIOPLASTY     KNEE SURGERY     VASECTOMY      Home Medications    Prior to Admission medications   Medication Sig Start Date End Date Taking? Authorizing Provider  atorvastatin (LIPITOR) 10 MG tablet TAKE 1 TABLET BY MOUTH EVERY DAY 09/16/20   Nestor Ramp, MD  buPROPion (WELLBUTRIN XL) 150 MG 24 hr tablet TAKE 1 TABLET BY MOUTH EVERY DAY 09/16/20   Nestor Ramp, MD  triamterene-hydrochlorothiazide  (DYAZIDE) 37.5-25 MG capsule TAKE 1 EACH (1 CAPSULE TOTAL) BY MOUTH DAILY. 09/16/20   Nestor Ramp, MD  aspirin 81 MG EC tablet Take 81 mg by mouth daily.      [provider]  cyclobenzaprine (FLEXERIL) 5 MG tablet Take 1 tablet (5 mg total) by mouth 2 (two) times daily as needed for muscle spasms. 10/13/19   Nestor Ramp, MD  fexofenadine (ALLEGRA) 30 MG tablet Take 2 tablets (60 mg total) by mouth 2 (two) times daily. 01/29/15   Karamalegos, Netta Neat, DO  fluticasone (FLONASE) 50 MCG/ACT nasal spray Place 2 sprays into both nostrils daily. 01/29/15   Karamalegos, Netta Neat, DO  meclizine (ANTIVERT) 25 MG tablet Take 1 tablet (25 mg total) by mouth 3 (three) times daily as needed for dizziness. 08/26/15   Rumley, Lora Havens, DO  omeprazole (PRILOSEC) 20 MG capsule TAKE 1 CAPSULE BY MOUTH EVERY DAY 10/29/20   Nestor Ramp, MD   Family History Family History  Problem Relation Age of Onset   Stroke Father 71   Cancer Father 60       prostate cancer   Social History Social History   Tobacco Use   Smoking status: Never   Smokeless tobacco: Never  Substance Use Topics   Alcohol use: No   Drug use: No   Allergies   Patient has no known allergies.  Review of Systems Review of  Systems Pertinent findings noted in history of present illness.   Physical Exam Triage Vital Signs ED Triage Vitals  Enc Vitals Group     BP 05/23/21 0827 (!) 147/82     Pulse Rate 05/23/21 0827 72     Resp 05/23/21 0827 18     Temp 05/23/21 0827 98.3 F (36.8 C)     Temp Source 05/23/21 0827 Oral     SpO2 05/23/21 0827 98 %     Weight --      Height --      Head Circumference --      Peak Flow --      Pain Score 05/23/21 0826 5     Pain Loc --      Pain Edu? --      Excl. in GC? --   No data found.  Updated Vital Signs BP 128/85 (BP Location: Left Arm)   Pulse 80   Temp 98.8 F (37.1 C) (Oral)   Resp (!) 8   SpO2 97%   Physical Exam Vitals and nursing note reviewed.  Constitutional:       General: He is not in acute distress.    Appearance: Normal appearance. He is not ill-appearing.  HENT:     Head: Normocephalic and atraumatic.     Salivary Glands: Right salivary gland is not diffusely enlarged or tender. Left salivary gland is not diffusely enlarged or tender.     Right Ear: Tympanic membrane, ear canal and external ear normal. No drainage. No middle ear effusion. There is no impacted cerumen. Tympanic membrane is not erythematous or bulging.     Left Ear: Tympanic membrane, ear canal and external ear normal. No drainage.  No middle ear effusion. There is no impacted cerumen. Tympanic membrane is not erythematous or bulging.     Nose: Nose normal. No nasal deformity, septal deviation, mucosal edema, congestion or rhinorrhea.     Right Turbinates: Not enlarged, swollen or pale.     Left Turbinates: Not enlarged, swollen or pale.     Right Sinus: No maxillary sinus tenderness or frontal sinus tenderness.     Left Sinus: No maxillary sinus tenderness or frontal sinus tenderness.     Mouth/Throat:     Lips: Pink. No lesions.     Mouth: Mucous membranes are moist. No oral lesions.     Pharynx: Oropharynx is clear. Uvula midline. No posterior oropharyngeal erythema or uvula swelling.     Tonsils: No tonsillar exudate. 0 on the right. 0 on the left.  Eyes:     General: Lids are normal.        Right eye: No discharge.        Left eye: No discharge.     Extraocular Movements: Extraocular movements intact.     Conjunctiva/sclera: Conjunctivae normal.     Right eye: Right conjunctiva is not injected.     Left eye: Left conjunctiva is not injected.  Neck:     Trachea: Trachea and phonation normal.  Cardiovascular:     Rate and Rhythm: Normal rate and regular rhythm.     Pulses: Normal pulses.     Heart sounds: Normal heart sounds. No murmur heard.   No friction rub. No gallop.  Pulmonary:     Effort: Pulmonary effort is normal. No accessory muscle usage, prolonged  expiration or respiratory distress.     Breath sounds: No stridor, decreased air movement or transmitted upper airway sounds. Examination of the right-upper field reveals rhonchi  and rales. Examination of the left-upper field reveals rhonchi and rales. Examination of the right-middle field reveals wheezing, rhonchi and rales. Examination of the left-middle field reveals wheezing, rhonchi and rales. Wheezing, rhonchi and rales present. No decreased breath sounds.     Comments: Bronchospasm with cough, coarse breath sounds throughout Chest:     Chest wall: No tenderness.  Musculoskeletal:        General: Normal range of motion.     Cervical back: Normal range of motion and neck supple. Normal range of motion.  Lymphadenopathy:     Cervical: No cervical adenopathy.  Skin:    General: Skin is warm and dry.     Findings: No erythema or rash.  Neurological:     General: No focal deficit present.     Mental Status: He is alert and oriented to person, place, and time.  Psychiatric:        Mood and Affect: Mood normal.        Behavior: Behavior normal.    Visual Acuity Right Eye Distance:   Left Eye Distance:   Bilateral Distance:    Right Eye Near:   Left Eye Near:    Bilateral Near:     UC Couse / Diagnostics / Procedures:    EKG  Radiology No results found.  Procedures Procedures (including critical care time)  UC Diagnoses / Final Clinical Impressions(s)   I have reviewed the triage vital signs and the nursing notes.  Pertinent labs & imaging results that were available during my care of the patient were reviewed by me and considered in my medical decision making (see chart for details).   Final diagnoses:  Acute bronchitis, unspecified organism  Wheezing  Bilateral rales  Scattered rhonchi of left lung  Scattered rhonchi of right lung  Acute maxillary sinusitis, recurrence not specified  Acute frontal sinusitis, recurrence not specified   We will treat patient  empirically for bacterial sinusitis and likely bronchitis with bacterial superinfection with Augmentin and steroids.  Patient also provided with a cough syrup to help him sleep at night.  Return precautions advised.  ED Prescriptions     Medication Sig Dispense Auth. Provider   methylPREDNISolone (MEDROL DOSEPAK) 4 MG TBPK tablet Take 24 mg on day 1, 20 mg on day 2, 16 mg on day 3, 12 mg on day 4, 8 mg on day 5, 4 mg on day 6. 21 tablet Theadora Rama Scales, PA-C   amoxicillin-clavulanate (AUGMENTIN) 875-125 MG tablet Take 1 tablet by mouth every 12 (twelve) hours for 6 days. 12 tablet Theadora Rama Scales, PA-C   promethazine-dextromethorphan (PROMETHAZINE-DM) 6.25-15 MG/5ML syrup Take 5 mLs by mouth 4 (four) times daily as needed for cough. 180 mL Theadora Rama Scales, PA-C      PDMP not reviewed this encounter.  Pending results:  Labs Reviewed - No data to display  Medications Ordered in UC: Medications  methylPREDNISolone sodium succinate (SOLU-MEDROL) 125 mg/2 mL injection 125 mg (125 mg Intramuscular Given 07/04/21 1309)    Disposition Upon Discharge:  Condition: stable for discharge home Home: take medications as prescribed; routine discharge instructions as discussed; follow up as advised.  Patient presented with an acute illness with associated systemic symptoms and significant discomfort requiring urgent management. In my opinion, this is a condition that a prudent lay person (someone who possesses an average knowledge of health and medicine) may potentially expect to result in complications if not addressed urgently such as respiratory distress, impairment of bodily function or dysfunction  of bodily organs.   Routine symptom specific, illness specific and/or disease specific instructions were discussed with the patient and/or caregiver at length.   As such, the patient has been evaluated and assessed, work-up was performed and treatment was provided in alignment with  urgent care protocols and evidence based medicine.  Patient/parent/caregiver has been advised that the patient may require follow up for further testing and treatment if the symptoms continue in spite of treatment, as clinically indicated and appropriate.  The patient was tested for COVID-19, Influenza and/or RSV, then the patient/parent/guardian was advised to isolate at home pending the results of his/her diagnostic coronavirus test and potentially longer if they're positive. I have also advised pt that if his/her COVID-19 test returns positive, it's recommended to self-isolate for at least 10 days after symptoms first appeared AND until fever-free for 24 hours without fever reducer AND other symptoms have improved or resolved. Discussed self-isolation recommendations as well as instructions for household member/close contacts as per the [facility] and [place] DHHS, and also gave patient the COVID packet with this information.  Patient/parent/caregiver has been advised to return to the [facility] or PCP in 3-5 days if no better; to PCP or the Emergency Department if new signs and symptoms develop, or if the current signs or symptoms continue to change or worsen for further workup, evaluation and treatment as clinically indicated and appropriate  The patient will follow up with their current PCP if and as advised. If the patient does not currently have a PCP we will assist them in obtaining one.   The patient may need specialty follow up if the symptoms continue, in spite of conservative treatment and management, for further workup, evaluation, consultation and treatment as clinically indicated and appropriate.  Patient/parent/caregiver verbalized understanding and agreement of plan as discussed.  All questions were addressed during visit.  Please see discharge instructions below for further details of plan.  Discharge Instructions:   Discharge Instructions      You received an injection of methylprednisolone in the  office today which should significantly begin to address the inflammation in your upper airways and lower airways.  You should notice that your sinuses drain more easily and your cough becomes more productive.  I would like for you to follow this high dose of methylprednisolone with a tapering dose, this has been prescribed to you in the form of a Dosepak, please take 1 row of tablets daily with your breakfast starting tomorrow morning.  I would also like for you to begin to take Augmentin, 1 tablet twice daily for the next 6 days while you are taking the Medrol Dosepak to address any possible bacterial superinfection in either your sinuses or your lungs.  I do not feel that bronchodilators such as an albuterol inhaler are needed at this time.  If that changes over the weekend, please feel free to call Monday morning and I will be happy to send one in for you.  I would like for you to continue taking Mucinex in the daytime, this is an expectorant and should loosen up the phlegm in your chest and your sinuses and allow everything to move more easily thereby decreasing your work of cough.  I have also provided you with a prescription for Promethazine DM which is a cough suppressant and mild nasal decongestant which you can take at bedtime if you like to help you sleep.  This medication is optional as it will provide no meaningful benefit to your bronchitis but keep in  mind that sleep is also necessary for your body to be able to recover from illness.          Theadora Rama Scales, PA-C 07/04/21 1550

## 2021-07-04 NOTE — Discharge Instructions (Signed)
You received an injection of methylprednisolone in the office today which should significantly begin to address the inflammation in your upper airways and lower airways.  You should notice that your sinuses drain more easily and your cough becomes more productive.  I would like for you to follow this high dose of methylprednisolone with a tapering dose, this has been prescribed to you in the form of a Dosepak, please take 1 row of tablets daily with your breakfast starting tomorrow morning.  I would also like for you to begin to take Augmentin, 1 tablet twice daily for the next 6 days while you are taking the Medrol Dosepak to address any possible bacterial superinfection in either your sinuses or your lungs.  I do not feel that bronchodilators such as an albuterol inhaler are needed at this time.  If that changes over the weekend, please feel free to call Monday morning and I will be happy to send one in for you.  I would like for you to continue taking Mucinex in the daytime, this is an expectorant and should loosen up the phlegm in your chest and your sinuses and allow everything to move more easily thereby decreasing your work of cough.  I have also provided you with a prescription for Promethazine DM which is a cough suppressant and mild nasal decongestant which you can take at bedtime if you like to help you sleep.  This medication is optional as it will provide no meaningful benefit to your bronchitis but keep in mind that sleep is also necessary for your body to be able to recover from illness.

## 2021-07-04 NOTE — ED Triage Notes (Signed)
Pt reports of having a cold for about a week with a cough. He states he has pressure to both ears.

## 2021-07-07 ENCOUNTER — Other Ambulatory Visit: Payer: Self-pay

## 2021-07-07 ENCOUNTER — Ambulatory Visit: Payer: BC Managed Care – PPO | Admitting: Family Medicine

## 2021-07-07 ENCOUNTER — Encounter: Payer: Self-pay | Admitting: Family Medicine

## 2021-07-07 DIAGNOSIS — H6121 Impacted cerumen, right ear: Secondary | ICD-10-CM | POA: Diagnosis not present

## 2021-07-07 NOTE — Progress Notes (Signed)
    SUBJECTIVE:   CHIEF COMPLAINT / HPI:   Left ear fullness.  Illness begain ~2 weeks ago with sinus pressure, bilateral ear discomfort and cough.  Went to urgent care where he was diagnosed with bronchitis and given steroids and amoxicillin.  Felt quickly better, except continues to have left ear fullness.  No nasal congestion.  Feels ears pop normally.    Also wanted lungs rechecked.  He reportedly had an abnormal lung exam at Urgent Care    OBJECTIVE:   BP 117/81   Pulse 81   Temp 98.6 F (37 C) (Oral)   Ht 6\' 1"  (1.854 m)   Wt 191 lb 6.4 oz (86.8 kg)   SpO2 98%   BMI 25.25 kg/m   Right TM normal Left TM obscured by cerumen.  Irrigated clear of large cerumen plug.  After irrigation, he felt his symptoms were fully resolved. Lung clear.  No wheeze or rales.  ASSESSMENT/PLAN:   Hearing loss due to cerumen impaction, right Improved post clearing of impaction.  Gave instructions if recurs, he may also have an element of eustacian tube dysfunction.     , MD [facility]

## 2021-07-07 NOTE — Assessment & Plan Note (Signed)
Improved post clearing of impaction.  Gave instructions if recurs, he may also have an element of eustacian tube dysfunction.

## 2021-07-07 NOTE — Patient Instructions (Signed)
Your lungs sound clear.  The medicines are working.  Finish them up. I hope I cured that ear congestion by getting that wax ball out. If the ear or sinus congestions comes back, I recommend using flonase regularly.  Google eustachian tube dysfunction.

## 2021-09-06 ENCOUNTER — Other Ambulatory Visit: Payer: Self-pay | Admitting: Family Medicine

## 2021-09-06 DIAGNOSIS — Z0000 Encounter for general adult medical examination without abnormal findings: Secondary | ICD-10-CM

## 2021-09-24 ENCOUNTER — Other Ambulatory Visit: Payer: Self-pay

## 2021-09-24 ENCOUNTER — Ambulatory Visit (INDEPENDENT_AMBULATORY_CARE_PROVIDER_SITE_OTHER): Payer: BC Managed Care – PPO | Admitting: Family Medicine

## 2021-09-24 VITALS — BP 109/77 | HR 78 | Wt 193.2 lb

## 2021-09-24 DIAGNOSIS — Z0000 Encounter for general adult medical examination without abnormal findings: Secondary | ICD-10-CM | POA: Diagnosis not present

## 2021-09-24 DIAGNOSIS — Z658 Other specified problems related to psychosocial circumstances: Secondary | ICD-10-CM | POA: Diagnosis not present

## 2021-09-24 DIAGNOSIS — I10 Essential (primary) hypertension: Secondary | ICD-10-CM

## 2021-09-24 DIAGNOSIS — E785 Hyperlipidemia, unspecified: Secondary | ICD-10-CM | POA: Diagnosis not present

## 2021-09-24 NOTE — Patient Instructions (Signed)
I will send you a note about  your labs! You look great! ?Set up your colonoscopy. ?

## 2021-09-25 ENCOUNTER — Encounter: Payer: Self-pay | Admitting: Family Medicine

## 2021-09-25 LAB — LIPID PANEL
Chol/HDL Ratio: 3.3 ratio (ref 0.0–5.0)
Cholesterol, Total: 128 mg/dL (ref 100–199)
HDL: 39 mg/dL — ABNORMAL LOW (ref >39)
LDL Chol Calc (NIH): 73 mg/dL (ref 0–99)
Triglycerides: 81 mg/dL (ref 0–149)
VLDL Cholesterol Cal: 16 mg/dL (ref 5–40)

## 2021-09-25 LAB — COMPREHENSIVE METABOLIC PANEL
ALT: 18 IU/L (ref 0–44)
AST: 17 IU/L (ref 0–40)
Albumin/Globulin Ratio: 2.1 (ref 1.2–2.2)
Albumin: 4.4 g/dL (ref 3.8–4.8)
Alkaline Phosphatase: 117 IU/L (ref 44–121)
BUN/Creatinine Ratio: 14 (ref 10–24)
BUN: 14 mg/dL (ref 8–27)
Bilirubin Total: 0.7 mg/dL (ref 0.0–1.2)
CO2: 23 mmol/L (ref 20–29)
Calcium: 9.4 mg/dL (ref 8.6–10.2)
Chloride: 104 mmol/L (ref 96–106)
Creatinine, Ser: 0.97 mg/dL (ref 0.76–1.27)
Globulin, Total: 2.1 g/dL (ref 1.5–4.5)
Glucose: 95 mg/dL (ref 70–99)
Potassium: 4.1 mmol/L (ref 3.5–5.2)
Sodium: 142 mmol/L (ref 134–144)
Total Protein: 6.5 g/dL (ref 6.0–8.5)
eGFR: 87 mL/min/{1.73_m2} (ref >59)

## 2021-09-25 LAB — PSA: Prostate Specific Ag, Serum: 0.4 ng/mL (ref 0.0–4.0)

## 2021-09-25 NOTE — Assessment & Plan Note (Signed)
Continue current meds and check labs today. ?

## 2021-09-25 NOTE — Progress Notes (Signed)
? ? ?  CHIEF COMPLAINT / HPI: ?Here for annual checkup.  Doing fairly well.  Lots of stressors with his mother having to be placed in the skilled nursing facility.  She is 93.  Her health is failing. ? ?And his wife have ?Started the process of moving to a new condominium in [place].  They are quite excited about this.  He is also returned to working 2 days a week at [employer]. ? ?No health complaints.  No problems with medication. ? ? ?PERTINENT  PMH / PSH: I have reviewed the patient?s medications, allergies, past medical and surgical history, smoking status and updated in the EMR as appropriate. ? ? ?OBJECTIVE: ? BP 109/77   Pulse 78   Wt 193 lb 3.2 oz (87.6 kg)   SpO2 99%   BMI 25.49 kg/m?  ? ?Vital signs reviewed. ?GENERAL: Well-developed, well-nourished, no acute distress. ?CARDIOVASCULAR: Regular rate and rhythm no murmur gallop or rub ?LUNGS: Clear to auscultation bilaterally, no rales or wheeze. ?ABDOMEN: Soft positive bowel sounds ?NEURO: No gross focal neurological deficits. ?MSK: Movement of extremity x 4. ?PSYCH: AxOx4. Good eye contact.. No psychomotor retardation or agitation. Appropriate speech fluency and content. Asks and answers questions appropriately. Mood is congruent. ? ? ?ASSESSMENT / PLAN: ? ? ?Essential hypertension, benign ?Good control, continue current meds. Labs ? ?Hyperlipidemia ?Continue current meds and check labs today. ? ?Psychosocial stressors ?Some ncrease in stressors but managing well. Has started CBT and I applaud that. ? ?Well adult exam ?Doing well.  Chronic problems are stable.  He is due his colonoscopy this year and he will set that up after they complete their moved to the new condominium.  It is due in April.  Does not want to pursue the shingles vaccine at this time.  Follow-up 6 months chronic problems, sooner with new issues.  I will notify him of labs. ?  ?Shawn Levy MD ?

## 2021-09-25 NOTE — Assessment & Plan Note (Signed)
Some ncrease in stressors but managing well. Has started CBT and I applaud that. ?

## 2021-09-25 NOTE — Assessment & Plan Note (Signed)
Doing well.  Chronic problems are stable.  He is due his colonoscopy this year and he will set that up after they complete their moved to the new condominium.  It is due in April.  Does not want to pursue the shingles vaccine at this time.  Follow-up 6 months chronic problems, sooner with new issues.  I will notify him of labs. ?

## 2021-09-25 NOTE — Assessment & Plan Note (Signed)
Good control, continue current meds. Labs ?

## 2021-09-26 ENCOUNTER — Encounter: Payer: Self-pay | Admitting: Family Medicine

## 2021-12-30 ENCOUNTER — Encounter: Payer: Self-pay | Admitting: *Deleted

## 2022-05-13 ENCOUNTER — Ambulatory Visit
Admission: RE | Admit: 2022-05-13 | Discharge: 2022-05-13 | Disposition: A | Discharge status: Home / Self Care | Payer: Medicare PPO | Source: Ambulatory Visit | Attending: Urgent Care | Admitting: Urgent Care

## 2022-05-13 ENCOUNTER — Ambulatory Visit (INDEPENDENT_AMBULATORY_CARE_PROVIDER_SITE_OTHER): Payer: Medicare PPO

## 2022-05-13 VITALS — BP 121/81 | HR 83 | Temp 98.0°F | Resp 16

## 2022-05-13 DIAGNOSIS — R059 Cough, unspecified: Secondary | ICD-10-CM

## 2022-05-13 DIAGNOSIS — Z8709 Personal history of other diseases of the respiratory system: Secondary | ICD-10-CM | POA: Diagnosis present

## 2022-05-13 DIAGNOSIS — Z1152 Encounter for screening for COVID-19: Secondary | ICD-10-CM | POA: Insufficient documentation

## 2022-05-13 DIAGNOSIS — R051 Acute cough: Secondary | ICD-10-CM | POA: Insufficient documentation

## 2022-05-13 DIAGNOSIS — B349 Viral infection, unspecified: Secondary | ICD-10-CM | POA: Insufficient documentation

## 2022-05-13 MED ORDER — PREDNISONE 20 MG PO TABS: 40.0000 mg | ORAL_TABLET | Freq: Every day | ORAL | 0 refills | Status: DC

## 2022-05-13 MED ORDER — DEXAMETHASONE SODIUM PHOSPHATE 10 MG/ML IJ SOLN
10.0000 mg | Freq: Once | INTRAMUSCULAR | Status: AC
  Administered 2022-05-13: 10 mg via INTRAMUSCULAR

## 2022-05-13 MED ORDER — BENZONATATE 100 MG PO CAPS: 100.0000 mg | ORAL_CAPSULE | Freq: Three times a day (TID) | ORAL | 0 refills | Status: DC | PRN

## 2022-05-13 NOTE — ED Triage Notes (Signed)
Pt states cough and congestion for the past 3 days.  Has been taking OTC cold medication with no improvement.

## 2022-05-13 NOTE — ED Provider Notes (Signed)
[facility]  Note:  This document was prepared using Systems analyst and may include unintentional dictation errors.  MRN: [id number] DOB: Mar 29, 1956  Subjective:   Shawn Fitzpatrick is a 66 y.o. male presenting for 3 day history of throat pain, rattling in his throat, cough, sinus congestion.  Has used multiple over-the-counter medications including DayQuil, NyQuil, Sudafed.  Had a bronchitis last year in December and feels this is the same. Reports that he would like "to nip it in the bud". Does not want to get worse as he waited too long last time. He is not opposed to a COVID test. Has gotten all his vaccines.  Patient is not a smoker.  No history of respiratory disorders.  No current facility-administered medications for this encounter.  Current Outpatient Medications:    aspirin 81 MG EC tablet, Take 81 mg by mouth daily.  , Disp: , Rfl:    atorvastatin (LIPITOR) 10 MG tablet, TAKE 1 TABLET BY MOUTH EVERY DAY, Disp: 90 tablet, Rfl: 3   buPROPion (WELLBUTRIN XL) 150 MG 24 hr tablet, TAKE 1 TABLET BY MOUTH EVERY DAY, Disp: 90 tablet, Rfl: 3   fluticasone (FLONASE) 50 MCG/ACT nasal spray, Place 2 sprays into both nostrils daily., Disp: 16 g, Rfl: 6   omeprazole (PRILOSEC) 20 MG capsule, TAKE 1 CAPSULE BY MOUTH EVERY DAY, Disp: 90 capsule, Rfl: 1   triamterene-hydrochlorothiazide (DYAZIDE) 37.5-25 MG capsule, TAKE 1 CAPSULE BY MOUTH EVERY DAY, Disp: 90 capsule, Rfl: 3   No Known Allergies  Past Medical History:  Diagnosis Date   Hyperlipidemia    Hypertension      Past Surgical History:  Procedure Laterality Date   ACROMIOPLASTY     KNEE SURGERY     VASECTOMY      Family History  Problem Relation Age of Onset   Stroke Father 47   Cancer Father 73       prostate cancer    Social History   Tobacco Use   Smoking status: Never   Smokeless tobacco: Never  Substance Use Topics   Alcohol use: No   Drug use: No     ROS   Objective:   Vitals: BP 121/81 (BP Location: Right Arm)   Pulse 83   Temp 98 F (36.7 C) (Oral)   Resp 16   SpO2 98%   Physical Exam Constitutional:      General: He is not in acute distress.    Appearance: Normal appearance. He is well-developed and normal weight. He is not ill-appearing, toxic-appearing or diaphoretic.  HENT:     Head: Normocephalic and atraumatic.     Right Ear: External ear normal.     Left Ear: External ear normal.     Nose: Nose normal.     Mouth/Throat:     Mouth: Mucous membranes are moist.     Pharynx: No pharyngeal swelling, oropharyngeal exudate, posterior oropharyngeal erythema or uvula swelling.     Tonsils: No tonsillar exudate or tonsillar abscesses. 0 on the right. 0 on the left.  Eyes:     General: No scleral icterus.       Right eye: No discharge.        Left eye: No discharge.     Extraocular Movements: Extraocular movements intact.  Cardiovascular:     Rate and Rhythm: Normal rate and regular rhythm.     Heart sounds: Normal heart sounds. No murmur heard.    No friction rub. No gallop.  Pulmonary:     Effort: Pulmonary effort is normal. No respiratory distress.     Breath sounds: Normal breath sounds. No stridor. No wheezing, rhonchi or rales.  Musculoskeletal:     Cervical back: Normal range of motion.  Neurological:     Mental Status: He is alert and oriented to person, place, and time.  Psychiatric:        Mood and Affect: Mood normal.        Behavior: Behavior normal.        Thought Content: Thought content normal.        Judgment: Judgment normal.    DG Chest 2 View  Result Date: 05/13/2022 CLINICAL DATA:  66 year old male presenting for evaluation of cough and congestion. EXAM: CHEST - 2 VIEW COMPARISON:  October 09, 2019. FINDINGS: The heart size and mediastinal contours are within normal limits. Both lungs are clear. The visualized skeletal structures are unremarkable. IMPRESSION: No active cardiopulmonary  disease. Electronically Signed   By: Zetta Bills M.D.   On: 05/13/2022 10:15    Assessment and Plan :   PDMP not reviewed this encounter.  1. Acute viral syndrome   2. Acute cough   3. History of bronchitis     Discussed antibiotic stewardship, will defer antibiotic use for now given timeline of his illness, negative chest x-Demetris. Patient requested same treatment with steroids as last time. Recommend supportive care for an acute viral syndrome, COVID testing is pending. Counseled patient on potential for adverse effects with medications prescribed/recommended today, ER and return-to-clinic precautions discussed, patient verbalized understanding.  Patient would be a good candidate for Paxlovid if he tests positive tomorrow. Hold atorvastatin.    Jaynee Eagles, PA-C 05/13/22 1027

## 2022-05-13 NOTE — Discharge Instructions (Signed)
We will notify you of your test results as they arrive and may take between about 24 hours.  I encourage you to sign up for MyChart if you have not already done so as this can be the easiest way for [place] to communicate results to you online or through a phone app.  Generally, we only contact you if it is a positive test result.  In the meantime, if you develop worsening symptoms including fever, chest pain, shortness of breath despite our current treatment plan then please report to the emergency room as this may be a sign of worsening status from possible viral infection.  Otherwise, we will manage this as a viral syndrome. For sore throat or cough try using a honey-based tea. Use 3 teaspoons of honey with juice squeezed from half lemon. Place shaved pieces of ginger into 1/2-1 cup of water and warm over stove top. Then mix the ingredients and repeat every 4 hours as needed. Please take Tylenol 500mg -650mg  every 6 hours for aches and pains, fevers. Hydrate very well with at least 2 liters of water. Eat light meals such as soups to replenish electrolytes and soft fruits, veggies. Start an antihistamine like Zyrtec for postnasal drainage, sinus congestion.  You can take this together with the prednisone steroid.  Use the cough medications as needed.

## 2022-05-14 LAB — SARS CORONAVIRUS 2 (TAT 6-24 HRS): SARS Coronavirus 2: NEGATIVE

## 2022-06-19 ENCOUNTER — Ambulatory Visit
Admission: RE | Admit: 2022-06-19 | Discharge: 2022-06-19 | Disposition: A | Discharge status: Home / Self Care | Payer: Medicare PPO | Source: Ambulatory Visit | Attending: Urgent Care | Admitting: Urgent Care

## 2022-06-19 VITALS — BP 111/82 | HR 94 | Temp 99.6°F | Resp 18

## 2022-06-19 DIAGNOSIS — I10 Essential (primary) hypertension: Secondary | ICD-10-CM

## 2022-06-19 DIAGNOSIS — U071 COVID-19: Secondary | ICD-10-CM

## 2022-06-19 MED ORDER — PAXLOVID (300/100) 20 X 150 MG & 10 X 100MG PO TBPK: ORAL_TABLET | ORAL | 0 refills | Status: DC

## 2022-06-19 MED ORDER — PROMETHAZINE-DM 6.25-15 MG/5ML PO SYRP: 2.5000 mL | ORAL_SOLUTION | Freq: Three times a day (TID) | ORAL | 0 refills | Status: DC | PRN

## 2022-06-19 MED ORDER — BENZONATATE 100 MG PO CAPS: 100.0000 mg | ORAL_CAPSULE | Freq: Three times a day (TID) | ORAL | 0 refills | Status: DC | PRN

## 2022-06-19 NOTE — ED Provider Notes (Signed)
[facility]  Note:  This document was prepared using Conservation officer, historic buildings and may include unintentional dictation errors.  MRN: [id number] DOB: Nov 26, 1955  Subjective:   Shawn Fitzpatrick is a 66 y.o. male presenting for 3-day history of acute onset persistent fever, body aches, sinus pressure and headaches, coughing.  Patient [facility] a [facility] yesterday and tested positive for COVID-19.  No history of respiratory disorders.  Patient is non-smoker.  He does have a history of hypertension and hyperlipidemia for which she takes medications.  Would like to have COVID antivirals.  No current facility-administered medications for this encounter.  Current Outpatient Medications:    aspirin 81 MG EC tablet, Take 81 mg by mouth daily.  , Disp: , Rfl:    atorvastatin (LIPITOR) 10 MG tablet, TAKE 1 TABLET BY MOUTH EVERY DAY, Disp: 90 tablet, Rfl: 3   buPROPion (WELLBUTRIN XL) 150 MG 24 hr tablet, TAKE 1 TABLET BY MOUTH EVERY DAY, Disp: 90 tablet, Rfl: 3   fluticasone (FLONASE) 50 MCG/ACT nasal spray, Place 2 sprays into both nostrils daily., Disp: 16 g, Rfl: 6   omeprazole (PRILOSEC) 20 MG capsule, TAKE 1 CAPSULE BY MOUTH EVERY DAY, Disp: 90 capsule, Rfl: 1   triamterene-hydrochlorothiazide (DYAZIDE) 37.5-25 MG capsule, TAKE 1 CAPSULE BY MOUTH EVERY DAY, Disp: 90 capsule, Rfl: 3   No Known Allergies  Past Medical History:  Diagnosis Date   Hyperlipidemia    Hypertension      Past Surgical History:  Procedure Laterality Date   ACROMIOPLASTY     KNEE SURGERY     VASECTOMY      Family History  Problem Relation Age of Onset   Stroke Father 88   Cancer Father 87       prostate cancer    Social History   Tobacco Use   Smoking status: Never   Smokeless tobacco: Never  Substance Use Topics   Alcohol use: Yes    Comment: occ   Drug use: No    ROS   Objective:   Vitals: BP 111/82 (BP Location: Left Arm)   Pulse 94   Temp 99.6 F  (37.6 C) (Oral)   Resp 18   SpO2 96%   Physical Exam Constitutional:      General: He is not in acute distress.    Appearance: Normal appearance. He is well-developed. He is not ill-appearing, toxic-appearing or diaphoretic.  HENT:     Head: Normocephalic and atraumatic.     Right Ear: External ear normal.     Left Ear: External ear normal.     Nose: Nose normal.     Mouth/Throat:     Mouth: Mucous membranes are moist.     Pharynx: No oropharyngeal exudate or posterior oropharyngeal erythema.  Eyes:     General: No scleral icterus.       Right eye: No discharge.        Left eye: No discharge.     Extraocular Movements: Extraocular movements intact.  Cardiovascular:     Rate and Rhythm: Normal rate and regular rhythm.     Heart sounds: Normal heart sounds. No murmur heard.    No friction rub. No gallop.  Pulmonary:     Effort: Pulmonary effort is normal. No respiratory distress.     Breath sounds: Normal breath sounds. No stridor. No wheezing, rhonchi or rales.  Neurological:     Mental Status: He is alert and oriented to person, place, and time.  Psychiatric:        Mood and Affect: Mood normal.        Behavior: Behavior normal.        Thought Content: Thought content normal.    No history of CKD, last GFR from this year was greater than 60.  Assessment and Plan :   PDMP not reviewed this encounter.  1. Clinical diagnosis of COVID-19   2. Essential hypertension     Patient presented the result from the [facility] clinic and therefore I will honor that.  Start Paxlovid to help with his COVID-19 infection.  Recommend supportive care otherwise. Deferred imaging given clear cardiopulmonary exam, hemodynamically stable vital signs. Counseled patient on potential for adverse effects with medications prescribed/recommended today, ER and return-to-clinic precautions discussed, patient verbalized understanding.    Jaynee Eagles, [place] 06/19/22 [phone]

## 2022-06-19 NOTE — ED Triage Notes (Signed)
Pt c/o flu like sx day 3-states he tested +covid at [facility] yesterday-NAD-steady gait

## 2022-08-31 ENCOUNTER — Other Ambulatory Visit: Payer: Self-pay | Admitting: Family Medicine

## 2022-09-20 ENCOUNTER — Other Ambulatory Visit: Payer: Self-pay | Admitting: Family Medicine

## 2022-09-30 ENCOUNTER — Ambulatory Visit
Admission: RE | Admit: 2022-09-30 | Discharge: 2022-09-30 | Disposition: A | Discharge status: Home / Self Care | Payer: Medicare PPO | Source: Ambulatory Visit | Attending: Family Medicine | Admitting: Family Medicine

## 2022-09-30 ENCOUNTER — Ambulatory Visit (INDEPENDENT_AMBULATORY_CARE_PROVIDER_SITE_OTHER): Payer: Medicare PPO | Admitting: Family Medicine

## 2022-09-30 ENCOUNTER — Encounter: Payer: Self-pay | Admitting: Family Medicine

## 2022-09-30 VITALS — BP 112/68 | HR 68 | Wt 195.2 lb

## 2022-09-30 DIAGNOSIS — Z23 Encounter for immunization: Secondary | ICD-10-CM

## 2022-09-30 DIAGNOSIS — M25532 Pain in left wrist: Secondary | ICD-10-CM

## 2022-09-30 DIAGNOSIS — Z658 Other specified problems related to psychosocial circumstances: Secondary | ICD-10-CM | POA: Diagnosis not present

## 2022-09-30 DIAGNOSIS — I10 Essential (primary) hypertension: Secondary | ICD-10-CM | POA: Diagnosis not present

## 2022-09-30 DIAGNOSIS — Z0000 Encounter for general adult medical examination without abnormal findings: Secondary | ICD-10-CM

## 2022-09-30 MED ORDER — MELOXICAM 15 MG PO TABS: 15.0000 mg | ORAL_TABLET | Freq: Every day | ORAL | 0 refills | Status: DC

## 2022-09-30 MED ORDER — SULFAMETHOXAZOLE-TRIMETHOPRIM 800-160 MG PO TABS: 1.0000 | ORAL_TABLET | Freq: Two times a day (BID) | ORAL | 0 refills | Status: DC

## 2022-09-30 NOTE — Progress Notes (Signed)
    CHIEF COMPLAINT / HPI:  CPE/check up and left wrist pain Has been bowling more recently. Wrist does not hurt while bowling bt sometimes next day or so after. Ulnar side. Mild. Still caring for his elderly mother. Seeing a therapist which has been a big help Still working a couple of days a week at [employer]   Has had a week of sinus congestion. Getting worse. Now pain in his area behind eyes. No fever. Feels like he is getting sinus infection  PERTINENT  PMH / PSH: I have reviewed the patient's medications, allergies, past medical and surgical history, smoking status and updated in the EMR as appropriate.   OBJECTIVE:  BP 112/68   Pulse 68   Wt 195 lb 3.2 oz (88.5 kg)   SpO2 95%   BMI 25.75 kg/m  Vital signs reviewed. GENERAL: Well-developed, well-nourished, no acute distress. CARDIOVASCULAR: Regular rate and rhythm no murmur gallop or rub LUNGS: Clear to auscultation bilaterally, no rales or wheeze. ABDOMEN: Soft positive bowel sounds NEURO: No gross focal neurological deficits. MSK: Movement of extremity x 4. PSYCH: AxOx4. Good eye contact.. No psychomotor retardation or agitation. Appropriate speech fluency and content. Asks and answers questions appropriately. Mood is congruent.   ASSESSMENT / PLAN: Left wrist pain, intermittent. Likely has ulnar + wrist based on exam. Will try meloxicam orally 2 weeks. Get X Aerik. If not improved after this, f/u and will [place], possibly inject. Sinus pain: gave him rx to have in case t his does turn into sinusitis; he is very familiar with sinus issues and tx  Essential hypertension, benign Good control Labs Continue current meds Congratulated on increasing activity  Psychosocial stressors Discussed Glad he has a therapist. Seems to have been helpful. Conitnue current meds  Well adult exam Updated labs and health maintenance issues Refills given as needed.   Dorcas Mcmurray MD

## 2022-09-30 NOTE — Patient Instructions (Signed)
Do not forget to set up your colonoscopy!.  I will send you note about your labs and your x-Kmarion.  I am calling in some medication for you to take once a day for the next 2 weeks.  It is meloxicam.  Take it once a day with food.  It is an NSAID.  Do not take over-the-counter Advil or ibuprofen or Aleve while you are taking this.  You can take Tylenol while you are taking this.  At the end of 2 weeks if your wrist is not better, schedule back to see me either here or at sports medicine and will do an ultrasound and/or inject the wrist.  You will be getting a call about annual wellness visit sometime in the next few weeks. I have placed an order for you to get an x-Lemarcus of your left wrist.  I will send you a copy of the report once I get it.  If there is anything wildly abnormal on it, I will call you otherwise I will just send you a note.  I have given you your pneumonia shot today  Best of luck working through these issues with your family.  It was great to see you!

## 2022-10-01 ENCOUNTER — Encounter: Payer: Self-pay | Admitting: Family Medicine

## 2022-10-01 LAB — LIPID PANEL
Chol/HDL Ratio: 3.4 ratio (ref 0.0–5.0)
Cholesterol, Total: 132 mg/dL (ref 100–199)
HDL: 39 mg/dL — ABNORMAL LOW (ref >39)
LDL Chol Calc (NIH): 73 mg/dL (ref 0–99)
Triglycerides: 108 mg/dL (ref 0–149)
VLDL Cholesterol Cal: 20 mg/dL (ref 5–40)

## 2022-10-01 LAB — BASIC METABOLIC PANEL
BUN/Creatinine Ratio: 12 (ref 10–24)
BUN: 12 mg/dL (ref 8–27)
CO2: 22 mmol/L (ref 20–29)
Calcium: 9.7 mg/dL (ref 8.6–10.2)
Chloride: 100 mmol/L (ref 96–106)
Creatinine, Ser: 0.97 mg/dL (ref 0.76–1.27)
Glucose: 87 mg/dL (ref 70–99)
Potassium: 4.2 mmol/L (ref 3.5–5.2)
Sodium: 138 mmol/L (ref 134–144)
eGFR: 87 mL/min/{1.73_m2} (ref >59)

## 2022-10-02 ENCOUNTER — Other Ambulatory Visit: Payer: Self-pay | Admitting: Family Medicine

## 2022-10-02 ENCOUNTER — Telehealth: Payer: Self-pay

## 2022-10-02 ENCOUNTER — Encounter: Payer: Self-pay | Admitting: Family Medicine

## 2022-10-02 DIAGNOSIS — Z125 Encounter for screening for malignant neoplasm of prostate: Secondary | ICD-10-CM

## 2022-10-02 NOTE — Telephone Encounter (Signed)
Patient calls nurse line in regards to labs.   He reports he received his results on mychart, however noticed a PSA was not done.   Patient is requesting to come in for PSA blood work.  Will forward to PCP to place order if appropriate.

## 2022-10-02 NOTE — Assessment & Plan Note (Signed)
Good control Labs Continue current meds Congratulated on increasing activity

## 2022-10-02 NOTE — Assessment & Plan Note (Signed)
Updated labs and health maintenance issues Refills given as needed.

## 2022-10-02 NOTE — Assessment & Plan Note (Signed)
Discussed Glad he has a therapist. Seems to have been helpful. Conitnue current meds

## 2022-10-07 ENCOUNTER — Other Ambulatory Visit: Payer: Medicare PPO

## 2022-10-07 DIAGNOSIS — Z125 Encounter for screening for malignant neoplasm of prostate: Secondary | ICD-10-CM

## 2022-10-08 LAB — PSA: Prostate Specific Ag, Serum: 1 ng/mL (ref 0.0–4.0)

## 2022-10-09 ENCOUNTER — Encounter: Payer: Self-pay | Admitting: Family Medicine

## 2022-10-10 ENCOUNTER — Encounter: Payer: Self-pay | Admitting: Family Medicine

## 2022-10-18 ENCOUNTER — Other Ambulatory Visit: Payer: Self-pay | Admitting: Family Medicine

## 2022-10-18 DIAGNOSIS — Z0000 Encounter for general adult medical examination without abnormal findings: Secondary | ICD-10-CM

## 2022-10-27 ENCOUNTER — Other Ambulatory Visit: Payer: Self-pay | Admitting: Family Medicine

## 2022-11-06 ENCOUNTER — Telehealth: Payer: Self-pay | Admitting: Family Medicine

## 2022-11-06 NOTE — Telephone Encounter (Signed)
Contacted Lum[facility] L[facility] Gambler to schedule their annual wellness visit. Welcome to Medicare visit Due by 04/27/2023.  Thank you,  [facility] Direct dial  [phone]

## 2022-11-25 DIAGNOSIS — F4323 Adjustment disorder with mixed anxiety and depressed mood: Secondary | ICD-10-CM | POA: Diagnosis not present

## 2022-12-08 DIAGNOSIS — Z1211 Encounter for screening for malignant neoplasm of colon: Secondary | ICD-10-CM | POA: Diagnosis not present

## 2022-12-08 DIAGNOSIS — Z800 Family history of malignant neoplasm of digestive organs: Secondary | ICD-10-CM | POA: Diagnosis not present

## 2022-12-08 DIAGNOSIS — K648 Other hemorrhoids: Secondary | ICD-10-CM | POA: Diagnosis not present

## 2022-12-09 ENCOUNTER — Ambulatory Visit: Payer: Medicare PPO

## 2022-12-15 ENCOUNTER — Encounter: Payer: Self-pay | Admitting: Family Medicine

## 2023-01-08 ENCOUNTER — Encounter: Payer: Self-pay | Admitting: Family Medicine

## 2023-01-11 ENCOUNTER — Other Ambulatory Visit: Payer: Self-pay | Admitting: Family Medicine

## 2023-01-11 MED ORDER — TADALAFIL 10 MG PO TABS: 10.0000 mg | ORAL_TABLET | Freq: Every day | ORAL | 3 refills | Status: DC | PRN

## 2023-01-18 ENCOUNTER — Encounter: Payer: Self-pay | Admitting: Family Medicine

## 2023-01-20 DIAGNOSIS — F4323 Adjustment disorder with mixed anxiety and depressed mood: Secondary | ICD-10-CM | POA: Diagnosis not present

## 2023-02-01 ENCOUNTER — Encounter: Payer: Self-pay | Admitting: Family Medicine

## 2023-02-01 ENCOUNTER — Other Ambulatory Visit: Payer: Self-pay | Admitting: Family Medicine

## 2023-02-01 DIAGNOSIS — N529 Male erectile dysfunction, unspecified: Secondary | ICD-10-CM

## 2023-02-01 DIAGNOSIS — Z8042 Family history of malignant neoplasm of prostate: Secondary | ICD-10-CM

## 2023-02-16 ENCOUNTER — Other Ambulatory Visit: Payer: Self-pay

## 2023-02-16 ENCOUNTER — Ambulatory Visit
Admission: RE | Admit: 2023-02-16 | Discharge: 2023-02-16 | Disposition: A | Discharge status: Home / Self Care | Payer: Medicare PPO | Source: Ambulatory Visit | Attending: Family Medicine | Admitting: Family Medicine

## 2023-02-16 ENCOUNTER — Encounter: Payer: Self-pay | Admitting: Family Medicine

## 2023-02-16 ENCOUNTER — Ambulatory Visit: Payer: Medicare PPO | Admitting: Family Medicine

## 2023-02-16 VITALS — BP 99/74 | HR 70 | Ht 73.0 in | Wt 194.6 lb

## 2023-02-16 DIAGNOSIS — M545 Low back pain, unspecified: Secondary | ICD-10-CM

## 2023-02-16 NOTE — Assessment & Plan Note (Signed)
Worsened.  Consistent with potential nerve compression given symptoms are only with prolonged sitting. No signs of infection or tumor or fracture.  However he is over 60 so will get imaging to rule out possible cancer.  Recommend frequent position change and to avoid prolonged sitting

## 2023-02-16 NOTE — Patient Instructions (Signed)
Good to see you today - Thank you for coming in  Things we discussed today:  Back Pain - change position when it becomes painful - If not better in 6-12 weeks or if worsening - weakness any incontinence or rest pain let us know - do not sit for prolonged periods for a week - I will send you a mychart message about the xray

## 2023-02-16 NOTE — Progress Notes (Signed)
    SUBJECTIVE:   CHIEF COMPLAINT / HPI:   Back Pain On and off for a while.  Had episode somewhat similar in 2021.  Never had imaging.  Pain is when he sits for prolonged periods especially bad with driving.  None with movement.  No weakness or shooting pains or incontinence.  No weight loss or fevers or night pain.  Medications dont seem to help much.  Changing position does.  Currently has no pain today.  He is concerned is this could be cancer  PERTINENT  PMH / PSH: Hypertension, Lipids, Mobic 15 mg daily    OBJECTIVE:   BP 99/74   Pulse 70   Ht 6\' 1"  (1.854 m)   Wt 194 lb 9.6 oz (88.3 kg)   SpO2 99%   BMI 25.67 kg/m   Neurologic exam : Strength equal & normal in lower extremities Able to walk on heels and toes.     Back - Normal skin, Spine with normal alignment and no deformity.  No tenderness to vertebral process palpation.  Paraspinous muscles are not tender and without spasm.   Range of motion is full at neck and lumbar sacral regions      ASSESSMENT/PLAN:   Acute bilateral low back pain without sciatica Assessment & Plan: Worsened.  Consistent with potential nerve compression given symptoms are only with prolonged sitting. No signs of infection or tumor or fracture.  However he is over 61 so will get imaging to rule out possible cancer.  Recommend frequent position change and to avoid prolonged sitting   Orders: -     DG Lumbar Spine 2-3 Views; Future     Patient Instructions  Good to see you today - Thank you for coming in  Things we discussed today:  Back Pain - change position when it becomes painful - If not better in 6-12 weeks or if worsening - weakness any incontinence or rest pain let us know - do not sit for prolonged periods for a week - I will send you a mychart message about the xray   Carney Living, MD [facility]

## 2023-03-19 ENCOUNTER — Other Ambulatory Visit: Payer: Self-pay | Admitting: Family Medicine

## 2023-03-24 DIAGNOSIS — R6882 Decreased libido: Secondary | ICD-10-CM | POA: Diagnosis not present

## 2023-03-24 DIAGNOSIS — N5311 Retarded ejaculation: Secondary | ICD-10-CM | POA: Diagnosis not present

## 2023-03-24 DIAGNOSIS — N5201 Erectile dysfunction due to arterial insufficiency: Secondary | ICD-10-CM | POA: Diagnosis not present

## 2023-03-31 DIAGNOSIS — F4323 Adjustment disorder with mixed anxiety and depressed mood: Secondary | ICD-10-CM | POA: Diagnosis not present

## 2023-05-17 ENCOUNTER — Ambulatory Visit
Admission: RE | Admit: 2023-05-17 | Discharge: 2023-05-17 | Disposition: A | Discharge status: Home / Self Care | Payer: Medicare PPO | Source: Ambulatory Visit | Attending: Internal Medicine | Admitting: Internal Medicine

## 2023-05-17 VITALS — BP 119/80 | HR 78 | Temp 98.5°F | Resp 20

## 2023-05-17 DIAGNOSIS — R42 Dizziness and giddiness: Secondary | ICD-10-CM | POA: Diagnosis not present

## 2023-05-17 DIAGNOSIS — H6991 Unspecified Eustachian tube disorder, right ear: Secondary | ICD-10-CM

## 2023-05-17 MED ORDER — CETIRIZINE HCL 10 MG PO TABS: 10.0000 mg | ORAL_TABLET | Freq: Every day | ORAL | 0 refills | Status: DC

## 2023-05-17 MED ORDER — PSEUDOEPHEDRINE HCL 60 MG PO TABS: 60.0000 mg | ORAL_TABLET | Freq: Three times a day (TID) | ORAL | 0 refills | Status: DC | PRN

## 2023-05-17 NOTE — Discharge Instructions (Signed)
If you continue to have symptoms of ear pain, ear fullness, right sided sinus symptoms by Friday of this week, then call and I will use a trial of amoxicillin to address a secondary sinus infection of the right side of your face. For now, use Zyrtec and pseudoephedrine.

## 2023-05-17 NOTE — ED Triage Notes (Signed)
Pt c/o nasal congestion, right ear pressure and "a little tremor" to right side of face-sx started 10/17-no relief with OTC meds-denies fever-NAD-steady gait

## 2023-05-17 NOTE — ED Provider Notes (Signed)
[facility]  Note:  This document was prepared using Conservation officer, historic buildings and may include unintentional dictation errors.  MRN: [id number] DOB: August 12, 1955  Subjective:   Shawn Fitzpatrick is a 67 y.o. male presenting for 4 to 5-day history of persistent right-sided ear pressure, ear fullness now having vertigo.  Has also had right-sided sinus congestion and facial spasms of the cheek area.  No headache, vision change, weakness, numbness or tingling.  No history of cerebrovascular disease, TIA, stroke.  No cough, chest pain, shortness of breath or wheezing.  No current facility-administered medications for this encounter.  Current Outpatient Medications:    atorvastatin (LIPITOR) 10 MG tablet, TAKE 1 TABLET BY MOUTH EVERY DAY, Disp: 90 tablet, Rfl: 2   buPROPion (WELLBUTRIN XL) 150 MG 24 hr tablet, TAKE 1 TABLET BY MOUTH EVERY DAY, Disp: 90 tablet, Rfl: 2   fluticasone (FLONASE) 50 MCG/ACT nasal spray, Place 2 sprays into both nostrils daily., Disp: 16 g, Rfl: 6   meloxicam (MOBIC) 15 MG tablet, TAKE 1 TABLET (15 MG TOTAL) BY MOUTH DAILY., Disp: 30 tablet, Rfl: 3   omeprazole (PRILOSEC) 20 MG capsule, TAKE 1 CAPSULE BY MOUTH EVERY DAY, Disp: 90 capsule, Rfl: 1   tadalafil (CIALIS) 10 MG tablet, Take 1 tablet (10 mg total) by mouth daily as needed for erectile dysfunction., Disp: 10 tablet, Rfl: 3   triamterene-hydrochlorothiazide (DYAZIDE) 37.5-25 MG capsule, TAKE 1 CAPSULE BY MOUTH EVERY DAY, Disp: 90 capsule, Rfl: 2   No Known Allergies  Past Medical History:  Diagnosis Date   Hyperlipidemia    Hypertension      Past Surgical History:  Procedure Laterality Date   ACROMIOPLASTY     KNEE SURGERY     VASECTOMY      Family History  Problem Relation Age of Onset   Stroke Father 58   Cancer Father 69       prostate cancer    Social History   Tobacco Use   Smoking status: Never   Smokeless tobacco: Never  Vaping Use   Vaping status:  Never Used  Substance Use Topics   Alcohol use: Yes    Comment: occ   Drug use: No    ROS   Objective:   Vitals: BP 119/80 (BP Location: Right Arm)   Pulse 78   Temp 98.5 F (36.9 C) (Oral)   Resp 20   SpO2 97%   Physical Exam Constitutional:      General: He is not in acute distress.    Appearance: Normal appearance. He is normal weight. He is not ill-appearing, toxic-appearing or diaphoretic.  HENT:     Head: Normocephalic and atraumatic.     Right Ear: Tympanic membrane, ear canal and external ear normal. No drainage, swelling or tenderness. No middle ear effusion. There is no impacted cerumen. Tympanic membrane is not erythematous or bulging.     Left Ear: Tympanic membrane, ear canal and external ear normal. No drainage, swelling or tenderness.  No middle ear effusion. There is no impacted cerumen. Tympanic membrane is not erythematous or bulging.     Nose: Nose normal. No congestion or rhinorrhea.     Mouth/Throat:     Mouth: Mucous membranes are moist.     Pharynx: No oropharyngeal exudate or posterior oropharyngeal erythema.  Eyes:     General: No scleral icterus.       Right eye: No discharge.        Left eye: No discharge.  Extraocular Movements: Extraocular movements intact.     Conjunctiva/sclera: Conjunctivae normal.  Cardiovascular:     Rate and Rhythm: Normal rate.  Pulmonary:     Effort: Pulmonary effort is normal.  Musculoskeletal:     Cervical back: Normal range of motion and neck supple. No rigidity. No muscular tenderness.  Neurological:     General: No focal deficit present.     Mental Status: He is alert and oriented to person, place, and time.     Cranial Nerves: No cranial nerve deficit.     Motor: No weakness.     Coordination: Coordination normal.     Gait: Gait normal.     Comments: No facial asymmetry.  No rash.  Negative Romberg and pronator drift.  Psychiatric:        Mood and Affect: Mood normal.        Behavior: Behavior normal.      Assessment and Plan :   PDMP not reviewed this encounter.  1. Eustachian tube dysfunction, right   2. Vertigo    Suspect primary issue is eustachian tube dysfunction.  Recommended conservative management.  Low suspicion for an acute encephalopathy.  I did advise patient that if he worsens over the next 5 days, can contact our clinic on Friday as I will be working.  I am okay with covering for right-sided maxillary sinusitis, right middle ear infection should his symptoms persist.  Counseled patient on potential for adverse effects with medications prescribed/recommended today, ER and return-to-clinic precautions discussed, patient verbalized understanding.    Wallis Bamberg, [place] 05/17/23 [phone]

## 2023-05-18 ENCOUNTER — Ambulatory Visit: Payer: Medicare PPO | Admitting: *Deleted

## 2023-05-18 DIAGNOSIS — Z23 Encounter for immunization: Secondary | ICD-10-CM

## 2023-05-18 DIAGNOSIS — Z0000 Encounter for general adult medical examination without abnormal findings: Secondary | ICD-10-CM | POA: Diagnosis not present

## 2023-05-18 NOTE — Patient Instructions (Signed)
Shawn Fitzpatrick , Thank you for taking time to come for your Medicare Wellness Visit. I appreciate your ongoing commitment to your health goals. Please review the following plan we discussed and let me know if I can assist you in the future.   Screening recommendations/referrals: Colonoscopy: up to date Recommended yearly ophthalmology/optometry visit for glaucoma screening and checkup Recommended yearly dental visit for hygiene and checkup  Vaccinations: Influenza vaccine: up to date Pneumococcal vaccine: up to date Tdap vaccine: up to date Shingles vaccine: Education provided    Advanced directives: Education provided  Preventive Care 65 Years and Older, Male Preventive care refers to lifestyle choices and visits with your health care provider that can promote health and wellness. What does preventive care include? A yearly physical exam. This is also called an annual well check. Dental exams once or twice a year. Routine eye exams. Ask your health care provider how often you should have your eyes checked. Personal lifestyle choices, including: Daily care of your teeth and gums. Regular physical activity. Eating a healthy diet. Avoiding tobacco and drug use. Limiting alcohol use. Practicing safe sex. Taking low doses of aspirin every day. Taking vitamin and mineral supplements as recommended by your health care provider. What happens during an annual well check? The services and screenings done by your health care provider during your annual well check will depend on your age, overall health, lifestyle risk factors, and family history of disease. Counseling  Your health care provider may ask you questions about your: Alcohol use. Tobacco use. Drug use. Emotional well-being. Home and relationship well-being. Sexual activity. Eating habits. History of falls. Memory and ability to understand (cognition). Work and work Astronomer. Screening  You may have the following tests  or measurements: Height, weight, and BMI. Blood pressure. Lipid and cholesterol levels. These may be checked every 5 years, or more frequently if you are over 9 years old. Skin check. Lung cancer screening. You may have this screening every year starting at age 54 if you have a 30-pack-year history of smoking and currently smoke or have quit within the past 15 years. Fecal occult blood test (FOBT) of the stool. You may have this test every year starting at age 31. Flexible sigmoidoscopy or colonoscopy. You may have a sigmoidoscopy every 5 years or a colonoscopy every 10 years starting at age 53. Prostate cancer screening. Recommendations will vary depending on your family history and other risks. Hepatitis C blood test. Hepatitis B blood test. Sexually transmitted disease (STD) testing. Diabetes screening. This is done by checking your blood sugar (glucose) after you have not eaten for a while (fasting). You may have this done every 1-3 years. Abdominal aortic aneurysm (AAA) screening. You may need this if you are a current or former smoker. Osteoporosis. You may be screened starting at age 28 if you are at high risk. Talk with your health care provider about your test results, treatment options, and if necessary, the need for more tests. Vaccines  Your health care provider may recommend certain vaccines, such as: Influenza vaccine. This is recommended every year. Tetanus, diphtheria, and acellular pertussis (Tdap, Td) vaccine. You may need a Td booster every 10 years. Zoster vaccine. You may need this after age 71. Pneumococcal 13-valent conjugate (PCV13) vaccine. One dose is recommended after age 54. Pneumococcal polysaccharide (PPSV23) vaccine. One dose is recommended after age 23. Talk to your health care provider about which screenings and vaccines you need and how often you need them. This information is  not intended to replace advice given to you by your health care provider. Make  sure you discuss any questions you have with your health care provider. Document Released: 08/09/2015 Document Revised: 04/01/2016 Document Reviewed: 05/14/2015 Elsevier Interactive Patient Education  2017 [employer].  Fall Prevention in the Home Falls can cause injuries. They can happen to people of all ages. There are many things you can do to make your home safe and to help prevent falls. What can I do on the outside of my home? Regularly fix the edges of walkways and driveways and fix any cracks. Remove anything that might make you trip as you walk through a door, such as a raised step or threshold. Trim any bushes or trees on the path to your home. Use bright outdoor lighting. Clear any walking paths of anything that might make someone trip, such as rocks or tools. Regularly check to see if handrails are loose or broken. Make sure that both sides of any steps have handrails. Any raised decks and porches should have guardrails on the edges. Have any leaves, snow, or ice cleared regularly. Use sand or salt on walking paths during winter. Clean up any spills in your garage right away. This includes oil or grease spills. What can I do in the bathroom? Use night lights. Install grab bars by the toilet and in the tub and shower. Do not use towel bars as grab bars. Use non-skid mats or decals in the tub or shower. If you need to sit down in the shower, use a plastic, non-slip stool. Keep the floor dry. Clean up any water that spills on the floor as soon as it happens. Remove soap buildup in the tub or shower regularly. Attach bath mats securely with double-sided non-slip rug tape. Do not have throw rugs and other things on the floor that can make you trip. What can I do in the bedroom? Use night lights. Make sure that you have a light by your bed that is easy to reach. Do not use any sheets or blankets that are too big for your bed. They should not hang down onto the floor. Have a firm  chair that has side arms. You can use this for support while you get dressed. Do not have throw rugs and other things on the floor that can make you trip. What can I do in the kitchen? Clean up any spills right away. Avoid walking on wet floors. Keep items that you use a lot in easy-to-reach places. If you need to reach something above you, use a strong step stool that has a grab bar. Keep electrical cords out of the way. Do not use floor polish or wax that makes floors slippery. If you must use wax, use non-skid floor wax. Do not have throw rugs and other things on the floor that can make you trip. What can I do with my stairs? Do not leave any items on the stairs. Make sure that there are handrails on both sides of the stairs and use them. Fix handrails that are broken or loose. Make sure that handrails are as long as the stairways. Check any carpeting to make sure that it is firmly attached to the stairs. Fix any carpet that is loose or worn. Avoid having throw rugs at the top or bottom of the stairs. If you do have throw rugs, attach them to the floor with carpet tape. Make sure that you have a light switch at the top of the  stairs and the bottom of the stairs. If you do not have them, ask someone to add them for you. What else can I do to help prevent falls? Wear shoes that: Do not have high heels. Have rubber bottoms. Are comfortable and fit you well. Are closed at the toe. Do not wear sandals. If you use a stepladder: Make sure that it is fully opened. Do not climb a closed stepladder. Make sure that both sides of the stepladder are locked into place. Ask someone to hold it for you, if possible. Clearly mark and make sure that you can see: Any grab bars or handrails. First and last steps. Where the edge of each step is. Use tools that help you move around (mobility aids) if they are needed. These include: Canes. Walkers. Scooters. Crutches. Turn on the lights when you go  into a dark area. Replace any light bulbs as soon as they burn out. Set up your furniture so you have a clear path. Avoid moving your furniture around. If any of your floors are uneven, fix them. If there are any pets around you, be aware of where they are. Review your medicines with your doctor. Some medicines can make you feel dizzy. This can increase your chance of falling. Ask your doctor what other things that you can do to help prevent falls. This information is not intended to replace advice given to you by your health care provider. Make sure you discuss any questions you have with your health care provider. Document Released: 05/09/2009 Document Revised: 12/19/2015 Document Reviewed: 08/17/2014 Elsevier Interactive Patient Education  2017 [employer].

## 2023-05-18 NOTE — Progress Notes (Signed)
Subjective:   Shawn Fitzpatrick is a 67 y.o. male who presents for an Initial Medicare Annual Wellness Visit.  Visit Complete: Virtual I connected with  Shawn Fitzpatrick on 05/18/23 by a audio enabled telemedicine application and verified that I am speaking with the correct person using two identifiers.  Patient Location: Home  Provider Location: Home Office  I discussed the limitations of evaluation and management by telemedicine. The patient expressed understanding and agreed to proceed.  Vital Signs: Because this visit was a virtual/telehealth visit, some criteria may be missing or patient reported. Any vitals not documented were not able to be obtained and vitals that have been documented are patient reported.  Patient Medicare AWV questionnaire was completed by the patient on 05-16-2023; I have confirmed that all information answered by patient is correct and no changes since this date.  Cardiac Risk Factors include: advanced age (>41men, >5 women);male gender;hypertension     Objective:    There were no vitals filed for this visit. There is no height or weight on file to calculate BMI.     05/18/2023    9:06 AM 09/30/2022    9:02 AM 09/24/2021    8:39 AM 09/20/2020    1:43 PM 05/15/2020    9:05 AM 03/13/2020    9:20 AM 04/25/2019    9:23 AM  Advanced Directives  Does Patient Have a Medical Advance Directive? Yes No No No No No No  Type of Advance Directive Living will        Would patient like information on creating a medical advance directive?  No - Patient declined No - Patient declined No - Patient declined No - Patient declined No - Patient declined No - Patient declined    Current Medications (verified) Outpatient Encounter Medications as of 05/18/2023  Medication Sig   atorvastatin (LIPITOR) 10 MG tablet TAKE 1 TABLET BY MOUTH EVERY DAY   buPROPion (WELLBUTRIN XL) 150 MG 24 hr tablet TAKE 1 TABLET BY MOUTH EVERY DAY   cetirizine (ZYRTEC ALLERGY) 10 MG tablet Take 1  tablet (10 mg total) by mouth daily.   fluticasone (FLONASE) 50 MCG/ACT nasal spray Place 2 sprays into both nostrils daily.   meloxicam (MOBIC) 15 MG tablet TAKE 1 TABLET (15 MG TOTAL) BY MOUTH DAILY.   omeprazole (PRILOSEC) 20 MG capsule TAKE 1 CAPSULE BY MOUTH EVERY DAY   pseudoephedrine (SUDAFED) 60 MG tablet Take 1 tablet (60 mg total) by mouth every 8 (eight) hours as needed for congestion.   tadalafil (CIALIS) 10 MG tablet Take 1 tablet (10 mg total) by mouth daily as needed for erectile dysfunction.   triamterene-hydrochlorothiazide (DYAZIDE) 37.5-25 MG capsule TAKE 1 CAPSULE BY MOUTH EVERY DAY   No facility-administered encounter medications on file as of 05/18/2023.    Allergies (verified) Patient has no known allergies.   History: Past Medical History:  Diagnosis Date   Hyperlipidemia    Hypertension    Past Surgical History:  Procedure Laterality Date   ACROMIOPLASTY     KNEE SURGERY     VASECTOMY     Family History  Problem Relation Age of Onset   Stroke Father 9   Cancer Father 24       prostate cancer   Social History   Socioeconomic History   Marital status: Married    Spouse name: Not on file   Number of children: Not on file   Years of education: Not on file   Highest education level: Master's degree (  e.g., MA, MS, MEng, MEd, MSW, MBA)  Occupational History   Not on file  Tobacco Use   Smoking status: Never   Smokeless tobacco: Never  Vaping Use   Vaping status: Never Used  Substance and Sexual Activity   Alcohol use: Yes    Comment: occ   Drug use: No   Sexual activity: Yes    Comment: vasectomy  Other Topics Concern   Not on file  Social History Narrative   Not on file   Social Determinants of Health   Financial Resource Strain: Low Risk  (05/18/2023)   Overall Financial Resource Strain (CARDIA)    Difficulty of Paying Living Expenses: Not hard at all  Food Insecurity: No Food Insecurity (05/18/2023)   Hunger Vital Sign    Worried  About Running Out of Food in the Last Year: Never true    Ran Out of Food in the Last Year: Never true  Transportation Needs: No Transportation Needs (05/18/2023)   PRAPARE - Administrator, Civil Service (Medical): No    Lack of Transportation (Non-Medical): No  Physical Activity: Insufficiently Active (05/18/2023)   Exercise Vital Sign    Days of Exercise per Week: 1 day    Minutes of Exercise per Session: 30 min  Stress: No Stress Concern Present (05/18/2023)   [employer] of Occupational Health - Occupational Stress Questionnaire    Feeling of Stress : Not at all  Social Connections: Moderately Integrated (05/18/2023)   Social Connection and Isolation Panel [NHANES]    Frequency of Communication with Friends and Family: Once a week    Frequency of Social Gatherings with Friends and Family: Once a week    Attends Religious Services: 1 to 4 times per year    Active Member of [employer] or Organizations: Yes    Attends Engineer, structural: More than 4 times per year    Marital Status: Married    Tobacco Counseling Counseling given: Not Answered   Clinical Intake:  Pre-visit preparation completed: Yes  Pain : No/denies pain     Diabetes: No  How often do you need to have someone help you when you read instructions, pamphlets, or other written materials from your doctor or pharmacy?: 1 - Never  Interpreter Needed?: No  Information entered by :: Remi Haggard LPN   Activities of Daily Living    05/18/2023    9:12 AM 05/16/2023    4:48 PM  In your present state of health, do you have any difficulty performing the following activities:  Hearing? 0 0  Vision? 0 0  Difficulty concentrating or making decisions? 0 0  Walking or climbing stairs? 0 0  Dressing or bathing? 0 0  Doing errands, shopping? 0 0  Preparing Food and eating ?  N  Using the Toilet? N N  In the past six months, have you accidently leaked urine? N N  Do you have problems with  loss of bowel control? N N  Managing your Medications? N N  Managing your Finances? N N  Housekeeping or managing your Housekeeping? N N    Patient Care Team: Nestor Ramp, MD as PCP - General  Indicate any recent Medical Services you may have received from other than [facility] providers in the past year (date may be approximate).     Assessment:   This is a routine wellness examination for Tavion.  Hearing/Vision screen Hearing Screening - Comments:: No trouble hearing Vision Screening - Comments:: Up to date  Vision works   Goals Addressed             This Visit's Progress    Patient Stated       Reduce stress better year.       Depression Screen    05/18/2023    9:07 AM 02/16/2023    9:25 AM 09/30/2022    9:02 AM 09/24/2021    8:39 AM 07/07/2021    9:33 AM 09/20/2020    1:52 PM 05/15/2020    9:04 AM  PHQ 2/9 Scores  PHQ - 2 Score 0 0 0 0 0 0 0  PHQ- 9 Score 0 0 0 0 1 0 0    Fall Risk    05/18/2023    9:05 AM 05/16/2023    4:48 PM 02/16/2023    9:25 AM 03/13/2020    9:19 AM 08/23/2019    8:53 AM  Fall Risk   Falls in the past year? 0 0 0 0 0  Number falls in past yr: 0 0 0 0 0  Injury with Fall? 0 0 0 0   Follow up Falls evaluation completed;Education provided;Falls prevention discussed    Falls evaluation completed    MEDICARE RISK AT HOME: Medicare Risk at Home Any stairs in or around the home?: No If so, are there any without handrails?: No Home free of loose throw rugs in walkways, pet beds, electrical cords, etc?: No Adequate lighting in your home to reduce risk of falls?: Yes Life alert?: No Use of a cane, walker or w/c?: No Grab bars in the bathroom?: Yes Shower chair or bench in shower?: No Elevated toilet seat or a handicapped toilet?: No  TIMED UP AND GO:  Was the test performed? No    Cognitive Function:        05/18/2023    9:05 AM  6CIT Screen  What Year? 0 points  What month? 0 points  What time? 0 points  Count back from 20 0  points  Months in reverse 0 points  Repeat phrase 0 points  Total Score 0 points    Immunizations Immunization History  Administered Date(s) Administered   Influenza Inj Mdck Quad Pf 04/13/2018   Influenza Split 04/18/2017   Influenza Whole 05/01/2010, 05/11/2011   Influenza,inj,Quad PF,6+ Mos 05/25/2013, 04/18/2017, 03/15/2019   Influenza-Unspecified 05/27/2014, 04/30/2015, 04/29/2016, 04/20/2018, 03/15/2019, 04/13/2020, 07/15/2022   PFIZER(Purple Top)SARS-COV-2 Vaccination 09/28/2019, 10/25/2019, 05/11/2020   PNEUMOCOCCAL CONJUGATE-20 09/30/2022   Pfizer(Comirnaty)Fall Seasonal Vaccine 12 years and older 07/15/2022   Td 09/21/2007   Tdap 09/01/2017    TDAP status: Up to date  Flu Vaccine status: Up to date  Pneumococcal vaccine status: Up to date  Covid-19 vaccine status: Information provided on how to obtain vaccines.   Qualifies for Shingles Vaccine? Yes   Zostavax completed No   Shingrix Completed?: No.    Education has been provided regarding the importance of this vaccine. Patient has been advised to call insurance company to determine out of pocket expense if they have not yet received this vaccine. Advised may also receive vaccine at local pharmacy or Health Dept. Verbalized acceptance and understanding.  Screening Tests Health Maintenance  Topic Date Due   COVID-19 Vaccine (5 - 2023-24 season) 03/28/2023   Zoster Vaccines- Shingrix (1 of 2) 09/30/2023 (Originally 05/24/2007)   Medicare Annual Wellness (AWV)  05/17/2024   DTaP/Tdap/Td (3 - Td or Tdap) 09/02/2027   Colonoscopy  12/15/2027   Pneumonia Vaccine 70+ Years old  Completed   INFLUENZA  VACCINE  Completed   Hepatitis C Screening  Completed   HIV Screening  Completed   HPV VACCINES  Aged Out    Health Maintenance  Health Maintenance Due  Topic Date Due   COVID-19 Vaccine (5 - 2023-24 season) 03/28/2023    Colorectal cancer screening: Type of screening: Colonoscopy. Completed 2024. Repeat every 5  years  Lung Cancer Screening: (Low Dose CT Chest recommended if Age 58-80 years, 20 pack-year currently smoking OR have quit w/in 15years.) does not qualify.   Lung Cancer Screening Referral:   Additional Screening:  Hepatitis C Screening: does not qualify; Completed 2017  Vision Screening: Recommended annual ophthalmology exams for early detection of glaucoma and other disorders of the eye. Is the patient up to date with their annual eye exam?  Yes  Who is the provider or what is the name of the office in which the patient attends annual eye exams? [facility] If pt is not established with a provider, would they like to be referred to a provider to establish care? No .   Dental Screening: Recommended annual dental exams for proper oral hygiene    Community Resource Referral / Chronic Care Management: CRR required this visit?  No   CCM required this visit?  No    Plan:     I have personally reviewed and noted the following in the patient's chart:   Medical and social history Use of alcohol, tobacco or illicit drugs  Current medications and supplements including opioid prescriptions. Patient is not currently taking opioid prescriptions. Functional ability and status Nutritional status Physical activity Advanced directives List of other physicians Hospitalizations, surgeries, and ER visits in previous 12 months Vitals Screenings to include cognitive, depression, and falls Referrals and appointments  In addition, I have reviewed and discussed with patient certain preventive protocols, quality metrics, and best practice recommendations. A written personalized care plan for preventive services as well as general preventive health recommendations were provided to patient.     Remi Haggard, LPN   [id number]/04/[id number]   After Visit Summary: (MyChart) Due to this being a telephonic visit, the after visit summary with patients personalized plan was offered to patient via MyChart    Nurse Notes:

## 2023-05-20 ENCOUNTER — Encounter: Payer: Self-pay | Admitting: Family Medicine

## 2023-05-20 ENCOUNTER — Other Ambulatory Visit: Payer: Self-pay

## 2023-05-20 ENCOUNTER — Ambulatory Visit (INDEPENDENT_AMBULATORY_CARE_PROVIDER_SITE_OTHER): Payer: Medicare PPO | Admitting: Family Medicine

## 2023-05-20 VITALS — BP 106/78 | HR 82 | Ht 73.0 in | Wt 191.8 lb

## 2023-05-20 DIAGNOSIS — J069 Acute upper respiratory infection, unspecified: Secondary | ICD-10-CM

## 2023-05-20 DIAGNOSIS — G514 Facial myokymia: Secondary | ICD-10-CM

## 2023-05-20 NOTE — Patient Instructions (Signed)
Good to see you today - Thank you for coming in  Things we discussed today:  1) Your symptoms are most likely due to a viral infection and sinus pressure causing your facial twitching. This should continue to improve over the next 1-2 weeks. - You apply flonase 2 puffs in each nostril every day until symptoms resolve - We will check your electrolytes and thyroid level - Try to cut down on caffeine over the next week to see if it will help  If your symptoms don't improve in 2 weeks, follow-up with your primary doctor.

## 2023-05-20 NOTE — Progress Notes (Signed)
    SUBJECTIVE:   CHIEF COMPLAINT / HPI:   Shawn Fitzpatrick is a 67yo M w/ hx of HTN, HLD that p/f UC follow-up for vertigo and R facial twitching - Started over this weekend, pt having R ear pressure and vertigo and R facial twitching.  - Went to UC on Monday for ear pressure, told he had fluid in R ear. - Reports vertigo is improved now. But still has R facial twitching feeling and R ear fullness sensation. Feels like R sided facial twitching is getting worse. - Also having some R sided sinus pressure and puffiness.  - Eating and drinking normally - Denies N/V/D, fevers - Denies any new medication changes  OBJECTIVE:   BP 106/78   Pulse 82   Ht 6\' 1"  (1.854 m)   Wt 191 lb 12.8 oz (87 kg)   SpO2 98%   BMI 25.30 kg/m   Gen: alert, well appearing older man HEENT: NCAT. MMM. PERRLA. Sensation of pressure with palpation of R sinuses. Face appears symmetric. BL TM pearly. Neuro: Occasional twitching of muscles around R side of nose/cheek CN2: no vision changes CN3,4,6: PERRLA. EOMI CN5: Sensation intact BL CN7: Facial expressions symmetric CN8: Hearing intact BL CN9: palate symmetric  CN10: regular speech CN11: turns head against resistance CN12: tongue midline  Resp: CTAB. Normal WOB on RA  ASSESSMENT/PLAN:   Assessment & Plan Facial twitching Differential includes irritation from sinus pressure, electrolyte abnormalities (calcium), hyperthyroid. Sinus pressure is most likely given recent viral infxn symptoms and sinus pressure coinciding with twitching. Will obtain BMP and TSH. - BMP - TSH - Advised to f/u if symptoms do not improve in 2 weeks Viral URI Pt w/ recent viral URI symptoms leading to vertigo, ear fullness, and potentially related to facial twitching as above. Symptoms seem to be improving, remains afebrile, and VSS.  - Flonase 2 puffs daily until symptoms resolve  Lincoln Brigham, MD [facility]

## 2023-05-21 ENCOUNTER — Other Ambulatory Visit: Payer: Self-pay | Admitting: Family Medicine

## 2023-05-21 LAB — BASIC METABOLIC PANEL
BUN/Creatinine Ratio: 13 (ref 10–24)
BUN: 13 mg/dL (ref 8–27)
CO2: 22 mmol/L (ref 20–29)
Calcium: 10 mg/dL (ref 8.6–10.2)
Chloride: 98 mmol/L (ref 96–106)
Creatinine, Ser: 1 mg/dL (ref 0.76–1.27)
Glucose: 106 mg/dL — ABNORMAL HIGH (ref 70–99)
Potassium: 4.2 mmol/L (ref 3.5–5.2)
Sodium: 138 mmol/L (ref 134–144)
eGFR: 84 mL/min/{1.73_m2} (ref >59)

## 2023-05-21 LAB — TSH RFX ON ABNORMAL TO FREE T4: TSH: 1.7 u[IU]/mL (ref 0.450–4.500)

## 2023-05-26 ENCOUNTER — Ambulatory Visit (INDEPENDENT_AMBULATORY_CARE_PROVIDER_SITE_OTHER): Payer: Medicare PPO | Admitting: Family Medicine

## 2023-05-26 ENCOUNTER — Encounter: Payer: Self-pay | Admitting: Family Medicine

## 2023-05-26 ENCOUNTER — Other Ambulatory Visit: Payer: Self-pay

## 2023-05-26 VITALS — BP 137/82 | HR 82 | Ht 73.0 in | Wt 194.6 lb

## 2023-05-26 DIAGNOSIS — G5131 Clonic hemifacial spasm, right: Secondary | ICD-10-CM

## 2023-05-26 NOTE — Patient Instructions (Addendum)
Good to see you today - Thank you for coming in  Things we discussed today:  Facial Spasm Continue with allergy treatments If notice any worsening or new symptoms (weakness, visual changes, balance problems) call us or go to the ER I put in a referral to ENT if symptoms resolve then cancel this  They should call you to schedule an appointment.  This could appear as an unknown number on your phone.  If you have not heard from them in 2 weeks please let me know.

## 2023-05-26 NOTE — Assessment & Plan Note (Signed)
Given started with URI symptoms I hope this is related to transient nerve compression irritation.  Will make referral to ENT if he does not improve.   No signs of CVA or ICP or MS or tumor

## 2023-05-26 NOTE — Progress Notes (Signed)
    SUBJECTIVE:   CHIEF COMPLAINT / HPI:   Face Twitching Started about 2 weeks ago along with very transient episode of vertigo and URI symptoms.  These are improving but twitching about the same.  Only on R side happens 2-3 times per hour.  Annoying not painful Seen 10/24 by Dr Sherrilee Gilles.   Thought to be viral URI related and treated with flonase.   No vision changes or weakness or problems hearing or speaking or any weakness.  Other wise feels well   OBJECTIVE:   BP 137/82   Pulse 82   Ht 6\' 1"  (1.854 m)   Wt 194 lb 9.6 oz (88.3 kg)   SpO2 100%   BMI 25.67 kg/m   Noticeable mild twitch between R eye and corner of mouth 1-2 times during the visit Ears:  External ear exam shows no significant lesions or deformities.  Otoscopic examination reveals clear canals, tympanic membranes are intact bilaterally without bulging, retraction, inflammation or discharge. Hearing is grossly normal bilaterall Throat: normal mucosa, no exudate, uvula midline, no redness Neck:  No deformities, thyromegaly, masses, or tenderness noted.   Supple with full range of motion without pain. Eye - Pupils Equal Round Reactive to light, Extraocular movements intact,  Conjunctiva without redness or discharge Neurologic exam : Cn 2-7 intact Strength equal & normal in upper & lower extremities Able to walk on heels and toes.   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  This could appear as an unknown number on your phone.  If you have  not heard from them in 2 weeks please let me know.    Carney Living, MD [facility]

## 2023-06-01 ENCOUNTER — Encounter (INDEPENDENT_AMBULATORY_CARE_PROVIDER_SITE_OTHER): Payer: Self-pay | Admitting: Otolaryngology

## 2023-06-02 ENCOUNTER — Ambulatory Visit: Payer: Medicare PPO | Admitting: Student

## 2023-06-30 ENCOUNTER — Ambulatory Visit (INDEPENDENT_AMBULATORY_CARE_PROVIDER_SITE_OTHER): Payer: Medicare PPO | Admitting: Otolaryngology

## 2023-06-30 ENCOUNTER — Encounter (INDEPENDENT_AMBULATORY_CARE_PROVIDER_SITE_OTHER): Payer: Self-pay

## 2023-06-30 VITALS — Ht 73.0 in | Wt 194.0 lb

## 2023-06-30 DIAGNOSIS — J018 Other acute sinusitis: Secondary | ICD-10-CM

## 2023-06-30 DIAGNOSIS — G5131 Clonic hemifacial spasm, right: Secondary | ICD-10-CM | POA: Diagnosis not present

## 2023-06-30 DIAGNOSIS — G510 Bell's palsy: Secondary | ICD-10-CM

## 2023-06-30 DIAGNOSIS — H938X1 Other specified disorders of right ear: Secondary | ICD-10-CM

## 2023-06-30 MED ORDER — PREDNISONE 10 MG PO TABS: 10.0000 mg | ORAL_TABLET | Freq: Every day | ORAL | 0 refills | Status: DC

## 2023-06-30 MED ORDER — AMOXICILLIN-POT CLAVULANATE 875-125 MG PO TABS: 1.0000 | ORAL_TABLET | Freq: Two times a day (BID) | ORAL | 0 refills | Status: AC

## 2023-06-30 NOTE — Patient Instructions (Signed)
Take Augmentin 875 mg by mouth (PO) twice daily for 12 days; take with food, take probiotic or yogurt with it Take Prednisone by mouth (PO) 30mg  x 4 days (3 pills in morning), then 20mg  x4 days (2 pills), then 10mg  x 4 days (1 pill), then stop. Risks discussed  I have ordered an imaging study for you to complete prior to your next visit. Please call [facility] Scheduling at [phone] to schedule your imaging if you have not received a call within 24 hours. If you are unable to complete your imaging study prior to your next scheduled visit please call our office to let us know.

## 2023-06-30 NOTE — Progress Notes (Signed)
Dear Dr. Deirdre Priest, Here is my assessment for our mutual patient, Shawn Fitzpatrick. Thank you for allowing me the opportunity to care for your patient. Please do not hesitate to contact me should you have any other questions. Sincerely, Dr. Jovita Kussmaul  Otolaryngology Clinic Note Referring provider: Dr. Deirdre Priest HPI:  Shawn Fitzpatrick is a 67 y.o. male kindly referred by Dr. Deirdre Priest for evaluation of facial twitching after sinusitis symptoms Initial visit (06/2023): He reports that he started to have some bilateral facial pressure and pain, frontal headache, lightheadedness (NOT vertigo) and some bilateral ear pressure in mid-late October. He went to UC and was prescribed Sudafed and Zyrtec and Flonase. Did not notice much change so then went to primary care, who noticed some right facial twitching and referred here.  He reports that he is feeling much better now, and the twitching has certainly improved (did not have any yesterday), but has some right ear pressure. He denies any sinonasal symptoms (pressure, pain, change in sense of smell, discolored drainage from nose). No fevers. No neuro sx otherwise including numbness, weakness, headaches, mental status changes No frequent sinus infections in past.  Using navage, flonase, zyrtec.  Patient reports from ear standpoint: some pressure in right ear but denies fullness. No facial numbness. No mental status or gait changes. He is able to pop his ear Patient denies: vertigo, drainage, tinnitus Patient additionally denies: deep pain in ear canal, eustachian tube symptoms such as popping, crackling, sensitivity to pressure changes Patient also denies barotrauma, vestibular suppressant use, ototoxic medication use Prior ear surgery: no  H&N Surgery: no Personal or FHx of bleeding dz or anesthesia difficulty: no   AP/AC: no  Tobacco: no. Alcohol: no. Occupation: retired - works 2d/week for [employer] now. Lives in [place], [place]  PMHx: HTN, HLD,  Back Pain  Independent Review of Additional Tests or Records:  UC 05/17/2023 [place] [facility] - 5d AD ear pressure and fullness; right sinus congesiton and facial spasms; no neuro sx; Dx ETD, rec conservative management Referral notes Dr. Sherrilee Gilles (05/20/2023): R facial twiching, R ear pressure, twiching worse. Some R sinus pressure. Neuro exam normal; noted occasional twitching around R nose/cheek; Rx flonase, BMP/TSH.  05/26/2023: Dr. Deirdre Priest - hemifacial spasm, continue flonase TSH 05/20/2023: normal BMP: wnl No prior head imaging  07/05/2023 Audiogram was independently reviewed and interpreted by me and it reveals AU: A/A tymps;  AU: normal, symmetric then sloping to mod to severe SNHL; WRT 92%, 96% AD, AS at 70 dB  SNHL= Sensorineural hearing loss   PMH/Meds/All/SocHx/FamHx/ROS:   Past Medical History:  Diagnosis Date   Hyperlipidemia    Hypertension      Past Surgical History:  Procedure Laterality Date   ACROMIOPLASTY     KNEE SURGERY     VASECTOMY      Family History  Problem Relation Age of Onset   Stroke Father 96   Cancer Father 65       prostate cancer     Social Connections: Moderately Integrated (05/25/2023)   Social Connection and Isolation Panel [NHANES]    Frequency of Communication with Friends and Family: Once a week    Frequency of Social Gatherings with Friends and Family: Once a week    Attends Religious Services: 1 to 4 times per year    Active Member of [employer] or Organizations: Yes    Attends Banker Meetings: 1 to 4 times per year    Marital Status: Married      Current Outpatient Medications:  amoxicillin-clavulanate (AUGMENTIN) 875-125 MG tablet, Take 1 tablet by mouth 2 (two) times daily for 12 days., Disp: 24 tablet, Rfl: 0   atorvastatin (LIPITOR) 10 MG tablet, TAKE 1 TABLET BY MOUTH EVERY DAY, Disp: 90 tablet, Rfl: 2   buPROPion (WELLBUTRIN XL) 150 MG 24 hr tablet, TAKE 1 TABLET BY MOUTH EVERY DAY, Disp: 90 tablet, Rfl: 2    cetirizine (ZYRTEC ALLERGY) 10 MG tablet, Take 1 tablet (10 mg total) by mouth daily., Disp: 30 tablet, Rfl: 0   fluticasone (FLONASE) 50 MCG/ACT nasal spray, Place 2 sprays into both nostrils daily., Disp: 16 g, Rfl: 6   meloxicam (MOBIC) 15 MG tablet, TAKE 1 TABLET (15 MG TOTAL) BY MOUTH DAILY., Disp: 30 tablet, Rfl: 3   omeprazole (PRILOSEC) 20 MG capsule, TAKE 1 CAPSULE BY MOUTH EVERY DAY, Disp: 90 capsule, Rfl: 1   predniSONE (DELTASONE) 10 MG tablet, Take 1 tablet (10 mg total) by mouth daily with breakfast. Take Prednisone by mouth (PO) 30mg  x 4 days (3 pills in morning), then 20mg  x4 days (2 pills), then 10mg  x 4 days (1 pill), then stop, Disp: 24 tablet, Rfl: 0   pseudoephedrine (SUDAFED) 60 MG tablet, Take 1 tablet (60 mg total) by mouth every 8 (eight) hours as needed for congestion., Disp: 30 tablet, Rfl: 0   tadalafil (CIALIS) 10 MG tablet, Take 1 tablet (10 mg total) by mouth daily as needed for erectile dysfunction., Disp: 10 tablet, Rfl: 3   triamterene-hydrochlorothiazide (DYAZIDE) 37.5-25 MG capsule, TAKE 1 CAPSULE BY MOUTH EVERY DAY, Disp: 90 capsule, Rfl: 2   Physical Exam:   Ht 6\' 1"  (1.854 m)   Wt 194 lb (88 kg)   BMI 25.60 kg/m   Salient findings:  CN II-XII intact - clear intermittent twitching of right cheek/midface branches. HB 1/6, no weakness however; tone appears normal however otherwise  Bilateral EAC clear and TM intact with well pneumatized middle ear spaces; no postauricular erythema or swelling Weber 512: midline Rinne 512: AC > BC b/l  Anterior rhinoscopy: Septum relatively midline; bilateral inferior turbinates without significant hypertrophy No lesions of oral cavity/oropharynx No obviously palpable neck masses/lymphadenopathy/thyromegaly No respiratory distress or stridor No extremity weakness, gait normal; A&O x3, affect normal. Appears well  Seprately Identifiable Procedures:  None, given unlikely primary nasal cause, will defer rigid endo for today;  will consider next time should he have sx persistent  Impression & Plans:  Branford Alvira is a 67 y.o. 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 Ear pressure, right   3. Acute non-recurrent sinusitis of other sinus    It is unclear to me what is causing this. The ear pressure and sinonasal symptoms do make sense, perhaps related to a URI and he is feeling better from that standpoint. No prior AI disease or Lyme exposure or other infectious exposure. He is generally otherwise healthy and denies significant medical problems. Query if this is related to an infection causing inflammation of CN 7? Review of literature suggests several causes including infectious, vascular loop at exit of CN 7, epilepsy, other central causes. As such, given rarity, will treat him aggressively and follow up. If does not improve, will likely refer to Neuro    1) Start prednisone and augmentin x12d as below 2) Continue flonase 3) Will get CT Face WITH contrast to rule out any peripheral lesions and eval sinuses and bony ears and MRI IAC to rule out any retrocochlear lesions 4) f/u in  2-3 weeks 5) Advised to call back if he has any other neuro symptoms, things get worse or new symptoms  See below regarding exact medications prescribed this encounter including dosages and route: Meds ordered this encounter  Medications   predniSONE (DELTASONE) 10 MG tablet    Sig: Take 1 tablet (10 mg total) by mouth daily with breakfast. Take Prednisone by mouth (PO) 30mg  x 4 days (3 pills in morning), then 20mg  x4 days (2 pills), then 10mg  x 4 days (1 pill), then stop    Dispense:  24 tablet    Refill:  0   amoxicillin-clavulanate (AUGMENTIN) 875-125 MG tablet    Sig: Take 1 tablet by mouth 2 (two) times daily for 12 days.    Dispense:  24 tablet    Refill:  0      Thank you for allowing me the opportunity to care for your patient. Please do not hesitate to contact me should you have any other  questions.  Sincerely, Jovita Kussmaul, MD Otolarynoglogist (ENT), [facility] Phone: [phone] Fax: [phone]  07/11/2023, 3:35 PM   MDM:  Level 4 Complexity/Problems addressed: new problem - unknown prognosis; acute Data complexity: low - independent review of labs, notes - Morbidity: mod  - Prescription Drug prescribed or managed: yes

## 2023-07-05 ENCOUNTER — Ambulatory Visit (INDEPENDENT_AMBULATORY_CARE_PROVIDER_SITE_OTHER): Payer: Medicare PPO | Admitting: Audiology

## 2023-07-05 DIAGNOSIS — H903 Sensorineural hearing loss, bilateral: Secondary | ICD-10-CM

## 2023-07-05 NOTE — Progress Notes (Signed)
  [address] [place], [place] [address] [phone]  Audiological Evaluation    Name: Shawn Fitzpatrick     DOB:   [birth year]      MRN:   [id number]                                                                                     Service Date: 07/05/2023     Accompanied by: unaccompanied   Patient comes today after Dr. Allena Katz, ENT sent a referral for a hearing evaluation due to concerns with right ear pressure.   Symptoms Yes Details  Hearing loss  []    Tinnitus  []    Ear pain/ Ear infections  [x]  Right ear pressure  Balance problems  []    Noise exposure  [x]  Some concerns and sport games in the past  Previous ear surgeries  []    Family history  [x]  Mother with age  Amplification  []    Other  []      Otoscopy: Right ear: Clear external ear canals and notable landmarks visualized on the tympanic membrane. Left ear:  Clear external ear canals and notable landmarks visualized on the tympanic membrane.  Tympanometry: Right ear: Type A- Normal external ear canal volume with normal middle ear pressure and tympanic membrane compliance Left ear: Type A- Normal external ear canal volume with normal middle ear pressure and tympanic membrane compliance   Pure tone Audiometry: Right ear- Normal, then sloping moderate to severe  sensorineural hearing loss from 3000 Hz - 8000 Hz. Left ear-  Normal, then sloping moderate to severe  sensorineural hearing loss from 3000 Hz - 8000 Hz.  The hearing test results were completed under headphones and results are deemed to be of good reliability. Test technique:  conventional     Speech Audiometry: Right ear- Speech Reception Threshold (SRT) was obtained at 10 dBHL Left ear-Speech Reception Threshold (SRT) was obtained at 10 dBHL   Word Recognition Score Tested using NU-6 (MLV) Right ear: 92% was obtained at a presentation level of 70 dBHL with contralateral masking which is deemed as  excellent Left ear: 96% was obtained at  a presentation level of 70 dBHL with contralateral masking which is deemed as  excellent    Impression: There is not a significant difference in pure-tone thresholds between ears. There is not a significant difference in the word recognition score in between ears.    Recommendations: Follow up with ENT as scheduled for today. Return for a hearing evaluation if concerns with hearing changes arise or per MD recommendation. Consider a communication needs assessment after medical clearance for hearing aids is obtained, pending patient motivation.   Shawn Fitzpatrick, AUD

## 2023-07-11 ENCOUNTER — Other Ambulatory Visit: Payer: Self-pay | Admitting: Family Medicine

## 2023-07-11 DIAGNOSIS — Z0000 Encounter for general adult medical examination without abnormal findings: Secondary | ICD-10-CM

## 2023-07-13 ENCOUNTER — Encounter (INDEPENDENT_AMBULATORY_CARE_PROVIDER_SITE_OTHER): Payer: Self-pay | Admitting: Otolaryngology

## 2023-07-14 ENCOUNTER — Ambulatory Visit
Admission: RE | Admit: 2023-07-14 | Discharge: 2023-07-14 | Disposition: A | Discharge status: Home / Self Care | Payer: Medicare PPO | Source: Ambulatory Visit | Attending: Otolaryngology

## 2023-07-14 ENCOUNTER — Other Ambulatory Visit: Payer: Medicare PPO

## 2023-07-14 DIAGNOSIS — H938X1 Other specified disorders of right ear: Secondary | ICD-10-CM

## 2023-07-14 DIAGNOSIS — J018 Other acute sinusitis: Secondary | ICD-10-CM

## 2023-07-14 DIAGNOSIS — R29810 Facial weakness: Secondary | ICD-10-CM | POA: Diagnosis not present

## 2023-07-14 DIAGNOSIS — G5131 Clonic hemifacial spasm, right: Secondary | ICD-10-CM

## 2023-07-14 MED ORDER — IOPAMIDOL (ISOVUE-300) INJECTION 61%
75.0000 mL | Freq: Once | INTRAVENOUS | Status: AC | PRN
  Administered 2023-07-14: 75 mL via INTRAVENOUS

## 2023-08-02 ENCOUNTER — Ambulatory Visit (INDEPENDENT_AMBULATORY_CARE_PROVIDER_SITE_OTHER): Payer: Medicare PPO

## 2023-08-05 ENCOUNTER — Ambulatory Visit
Admission: RE | Admit: 2023-08-05 | Discharge: 2023-08-05 | Disposition: A | Discharge status: Home / Self Care | Payer: Medicare PPO | Source: Ambulatory Visit | Attending: Physician Assistant | Admitting: Physician Assistant

## 2023-08-05 VITALS — BP 109/74 | HR 86 | Temp 97.9°F | Resp 16

## 2023-08-05 DIAGNOSIS — M545 Low back pain, unspecified: Secondary | ICD-10-CM | POA: Diagnosis not present

## 2023-08-05 MED ORDER — CYCLOBENZAPRINE HCL 10 MG PO TABS: 10.0000 mg | ORAL_TABLET | Freq: Three times a day (TID) | ORAL | 0 refills | Status: DC | PRN

## 2023-08-05 NOTE — ED Provider Notes (Signed)
 [facility]    CSN: [id number] Arrival date & time: 08/05/23  0944      History   Chief Complaint Chief Complaint  Patient presents with   Back Pain    Entered by patient    HPI Shawn Fitzpatrick is a 68 y.o. male.   Pt complains of low back pain.  Pt reports he was in a car a lot recently and now is having low back pin.  Pt reports he has had similar in the past.  Patient reports his doctor normally places him on Flexeril  and this helps with the discomfort.  Patient is scheduled to work today and tomorrow and needs a note for his employer.  Patient reports he has had the same pain in the past with overexertion.  The history is provided by the patient. No language interpreter was used.  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 Authorizing Provider  cyclobenzaprine  (FLEXERIL ) 10 MG tablet Take 1 tablet (10 mg total) by mouth 3 (three) times daily as needed for muscle spasms. 08/05/23  Yes Kerston Landeck K, PA-C  atorvastatin  (LIPITOR) 10 MG tablet TAKE 1 TABLET BY MOUTH EVERY DAY 03/19/23   Rosalynn Camie CROME, MD  buPROPion  (WELLBUTRIN  XL) 150 MG 24 hr tablet TAKE 1 TABLET BY MOUTH EVERY DAY 07/13/23   Rosalynn Camie CROME, MD  cetirizine  (ZYRTEC  ALLERGY) 10 MG tablet Take 1 tablet (10 mg total) by mouth daily. 05/17/23   Christopher Savannah, PA-C   fluticasone  (FLONASE ) 50 MCG/ACT nasal spray Place 2 sprays into both nostrils daily. 01/29/15   Karamalegos, Marsa PARAS, DO  meloxicam  (MOBIC ) 15 MG tablet TAKE 1 TABLET (15 MG TOTAL) BY MOUTH DAILY. 02/01/23   Rosalynn Camie CROME, MD  omeprazole  (PRILOSEC) 20 MG capsule TAKE 1 CAPSULE BY MOUTH EVERY DAY 10/29/20   Rosalynn Camie CROME, MD  predniSONE  (DELTASONE ) 10 MG tablet Take 1 tablet (10 mg total) by mouth daily with breakfast. Take Prednisone  by mouth (PO) 30mg  x 4 days (3 pills in morning), then 20mg  x4 days (2 pills), then 10mg  x 4 days (1 pill), then stop 06/30/23   Patel, Kunjan B, MD  pseudoephedrine  (SUDAFED) 60 MG tablet Take 1 tablet (60 mg total) by mouth every 8 (eight) hours as needed for congestion. 05/17/23   Christopher Savannah, PA-C  tadalafil  (CIALIS ) 10 MG tablet Take 1 tablet (10 mg total) by mouth daily as needed for erectile dysfunction. 01/11/23   Rosalynn Camie CROME, MD  triamterene -hydrochlorothiazide (DYAZIDE) 37.5-25 MG capsule TAKE 1 CAPSULE BY MOUTH EVERY DAY 05/23/23   Rosalynn Camie CROME, MD    Family History Family History  Problem Relation Age of Onset   Stroke Father 50   Cancer Father 53  prostate cancer    Social History Social History   Tobacco Use   Smoking status: Never   Smokeless tobacco: Never  Vaping Use   Vaping status: Never Used  Substance Use Topics   Alcohol use: Yes    Comment: occ   Drug use: No     Allergies   Patient has no known allergies.   Review of Systems Review of Systems  Musculoskeletal:  Positive for back pain.  All other systems reviewed and are negative.    Physical Exam Triage Vital Signs ED Triage Vitals  Encounter Vitals Group     BP 08/05/23 0952 109/74     Systolic BP Percentile --      Diastolic BP Percentile --      Pulse Rate 08/05/23 0952 86     Resp 08/05/23 0952 16     Temp 08/05/23 0952 97.9 F (36.6 C)     Temp Source 08/05/23 0952 Oral     SpO2 08/05/23 0952 95 %     Weight --      Height --      Head  Circumference --      Peak Flow --      Pain Score 08/05/23 0956 6     Pain Loc --      Pain Education --      Exclude from Growth Chart --    No data found.  Updated Vital Signs BP 109/74 (BP Location: Right Arm)   Pulse 86   Temp 97.9 F (36.6 C) (Oral)   Resp 16   SpO2 95%   Visual Acuity Right Eye Distance:   Left Eye Distance:   Bilateral Distance:    Right Eye Near:   Left Eye Near:    Bilateral Near:     Physical Exam Vitals and nursing note reviewed.  Constitutional:      General: He is not in acute distress.    Appearance: He is well-developed.  HENT:     Head: Normocephalic and atraumatic.  Eyes:     Conjunctiva/sclera: Conjunctivae normal.  Cardiovascular:     Rate and Rhythm: Normal rate.  Pulmonary:     Effort: Pulmonary effort is normal.  Abdominal:     Palpations: Abdomen is soft.     Tenderness: There is no abdominal tenderness.  Musculoskeletal:        General: No swelling.     Comments: Tender lower lumbar sacral spine diffusely  Skin:    General: Skin is warm and dry.     Capillary Refill: Capillary refill takes less than 2 seconds.  Neurological:     Mental Status: He is alert.  Psychiatric:        Mood and Affect: Mood normal.      UC Treatments / Results  Labs (all labs ordered are listed, but only abnormal results are displayed) Labs Reviewed - No data to display  EKG   Radiology No results found.  Procedures Procedures (including critical care time)  Medications Ordered in UC Medications - No data to display  Initial Impression / Assessment and Plan / UC Course  I have reviewed the triage vital signs and the nursing notes.  Pertinent labs & imaging results that were available during my care of the patient were reviewed by me and considered in my medical decision making (see chart for details).      Final Clinical Impressions(s) / UC Diagnoses   Final diagnoses:  Acute low back pain without sciatica,  unspecified back pain laterality     Discharge Instructions      Return if any problems.    ED Prescriptions     Medication Sig Dispense Auth. Provider   cyclobenzaprine  (FLEXERIL ) 10 MG tablet Take 1 tablet (10 mg total) by mouth 3 (three) times daily as needed for muscle spasms. 30 tablet Donyel Nester K, PA-C     An After Visit Summary was printed and given to the patient.     PDMP not reviewed this encounter.   Flint Sonny POUR, [place] 08/05/23 1049

## 2023-08-05 NOTE — Discharge Instructions (Signed)
 Return if any problems.

## 2023-08-05 NOTE — ED Triage Notes (Signed)
 Pt states lower back pain, denies injury.  States he has had this happen in the past. States he has been taking advil at home with some relief.

## 2023-08-17 ENCOUNTER — Encounter: Payer: Self-pay | Admitting: Family Medicine

## 2023-08-18 ENCOUNTER — Ambulatory Visit
Admission: RE | Admit: 2023-08-18 | Discharge: 2023-08-18 | Disposition: A | Discharge status: Home / Self Care | Payer: Medicare PPO | Source: Ambulatory Visit | Attending: Otolaryngology | Admitting: Otolaryngology

## 2023-08-18 DIAGNOSIS — G5139 Clonic hemifacial spasm, unspecified: Secondary | ICD-10-CM | POA: Diagnosis not present

## 2023-08-18 DIAGNOSIS — G5131 Clonic hemifacial spasm, right: Secondary | ICD-10-CM

## 2023-08-18 MED ORDER — GADOPICLENOL 0.5 MMOL/ML IV SOLN
7.5000 mL | Freq: Once | INTRAVENOUS | Status: AC | PRN
Start: 2023-08-18 — End: 2023-08-18
  Administered 2023-08-18: 7.5 mL via INTRAVENOUS

## 2023-08-23 DIAGNOSIS — F4323 Adjustment disorder with mixed anxiety and depressed mood: Secondary | ICD-10-CM | POA: Diagnosis not present

## 2023-08-24 ENCOUNTER — Telehealth (INDEPENDENT_AMBULATORY_CARE_PROVIDER_SITE_OTHER): Payer: Self-pay | Admitting: Otolaryngology

## 2023-08-24 NOTE — Telephone Encounter (Signed)
Confirmed address and appointment with patient for 08/25/2023.

## 2023-08-25 ENCOUNTER — Ambulatory Visit (INDEPENDENT_AMBULATORY_CARE_PROVIDER_SITE_OTHER): Payer: Medicare PPO | Admitting: Otolaryngology

## 2023-08-25 ENCOUNTER — Encounter (INDEPENDENT_AMBULATORY_CARE_PROVIDER_SITE_OTHER): Payer: Self-pay

## 2023-08-25 VITALS — BP 157/82 | HR 84 | Resp 19 | Wt 194.0 lb

## 2023-08-25 DIAGNOSIS — H903 Sensorineural hearing loss, bilateral: Secondary | ICD-10-CM | POA: Diagnosis not present

## 2023-08-25 DIAGNOSIS — G5131 Clonic hemifacial spasm, right: Secondary | ICD-10-CM | POA: Diagnosis not present

## 2023-08-25 NOTE — Progress Notes (Signed)
Dear Dr. Jennette Kettle, Here is my assessment for our mutual patient, Shawn Fitzpatrick. Thank you for allowing me the opportunity to care for your patient. Please do not hesitate to contact me should you have any other questions. Sincerely, Dr. Jovita Kussmaul  Otolaryngology Clinic Note Referring provider: Dr. Jennette Kettle HPI:  Shawn Fitzpatrick is a 68 y.o. male kindly referred by Dr. Jennette Kettle for evaluation of facial twitching after sinusitis symptoms  Initial visit (06/2023): He reports that he started to have some bilateral facial pressure and pain, frontal headache, lightheadedness (NOT vertigo) and some bilateral ear pressure in mid-late October. He went to UC and was prescribed Sudafed and Zyrtec and Flonase. Did not notice much change so then went to primary care, who noticed some right facial twitching and referred here.  He reports that he is feeling much better now, and the twitching has certainly improved (did not have any yesterday), but has some right ear pressure. He denies any sinonasal symptoms (pressure, pain, change in sense of smell, discolored drainage from nose). No fevers. No neuro sx otherwise including numbness, weakness, headaches, mental status changes No frequent sinus infections in past.  Using navage, flonase, zyrtec.  Patient reports from ear standpoint: some pressure in right ear but denies fullness. No facial numbness. No mental status or gait changes. He is able to pop his ear Patient denies: vertigo, drainage, tinnitus Patient additionally denies: deep pain in ear canal, eustachian tube symptoms such as popping, crackling, sensitivity to pressure changes Patient also denies barotrauma, vestibular suppressant use, ototoxic medication use Prior ear surgery: no --------------------------------------------------------- Endo and exam reassuring but given assoc of Hemifacial spasm with concerning etiologies, we obtained a CT and MRI and treated him aggressively. Seen in follow  up. 08/25/2023 Doing well - he continues to have asymptomatic hemifacial spasm. He did not like being on the steroids. Finished abx/steroids. No nasal or sinus or ear symptoms currently. He reports that is not having any facial weakness, numbness, other bulbar symptoms, no tick exposures, no other changes. Using flonase. CT and MRI discussed.   ----------------------------------------------------------- H&N Surgery: no Personal or FHx of bleeding dz or anesthesia difficulty: no   AP/AC: no  Tobacco: no. Alcohol: no. Occupation: retired - works 2d/week for [employer] now. Lives in [place], [place]  PMHx: HTN, HLD, Back Pain  Independent Review of Additional Tests or Records:  UC 05/17/2023 [place] [facility] - 5d AD ear pressure and fullness; right sinus congesiton and facial spasms; no neuro sx; Dx ETD, rec conservative management Referral notes Dr. Sherrilee Gilles (05/20/2023): R facial twiching, R ear pressure, twiching worse. Some R sinus pressure. Neuro exam normal; noted occasional twitching around R nose/cheek; Rx flonase, BMP/TSH.  05/26/2023: Dr. Deirdre Priest - hemifacial spasm, continue flonase TSH 05/20/2023: normal BMP: wnl No prior head imaging  07/05/2023 Audiogram was independently reviewed and interpreted by me and it reveals AU: A/A tymps;  AU: normal, symmetric then sloping to mod to severe SNHL; WRT 92%, 96% AD, AS at 70 dB  SNHL= Sensorineural hearing loss  CT Face w/contrast (07/14/2023) independently reviewed: no parotid nodules or stranding; no significant paranasal sinus disease; mastoids and ME well aerated; no masses along area of Stylomastoid foramen; right styloid slightly more prominent and right parotid on left slightly more heterogenous compared to left  MRI Brain/IAC independently reviewed and read pending (08/18/2023): no retrocochlear lesions noted; do not see any obvious vascular compression around facial nerve exit but small vessel surrounding it as it exits it  appears.  PMH/Meds/All/SocHx/FamHx/ROS:  Past Medical History:  Diagnosis Date   Hyperlipidemia    Hypertension      Past Surgical History:  Procedure Laterality Date   ACROMIOPLASTY     KNEE SURGERY     VASECTOMY      Family History  Problem Relation Age of Onset   Stroke Father 61   Cancer Father 75       prostate cancer     Social Connections: Moderately Integrated (05/25/2023)   Social Connection and Isolation Panel [NHANES]    Frequency of Communication with Friends and Family: Once a week    Frequency of Social Gatherings with Friends and Family: Once a week    Attends Religious Services: 1 to 4 times per year    Active Member of [employer] or Organizations: Yes    Attends Banker Meetings: 1 to 4 times per year    Marital Status: Married      Current Outpatient Medications:    atorvastatin (LIPITOR) 10 MG tablet, TAKE 1 TABLET BY MOUTH EVERY DAY, Disp: 90 tablet, Rfl: 2   buPROPion (WELLBUTRIN XL) 150 MG 24 hr tablet, TAKE 1 TABLET BY MOUTH EVERY DAY, Disp: 90 tablet, Rfl: 2   cetirizine (ZYRTEC ALLERGY) 10 MG tablet, Take 1 tablet (10 mg total) by mouth daily., Disp: 30 tablet, Rfl: 0   cyclobenzaprine (FLEXERIL) 10 MG tablet, Take 1 tablet (10 mg total) by mouth 3 (three) times daily as needed for muscle spasms., Disp: 30 tablet, Rfl: 0   fluticasone (FLONASE) 50 MCG/ACT nasal spray, Place 2 sprays into both nostrils daily., Disp: 16 g, Rfl: 6   meloxicam (MOBIC) 15 MG tablet, TAKE 1 TABLET (15 MG TOTAL) BY MOUTH DAILY., Disp: 30 tablet, Rfl: 3   omeprazole (PRILOSEC) 20 MG capsule, TAKE 1 CAPSULE BY MOUTH EVERY DAY, Disp: 90 capsule, Rfl: 1   predniSONE (DELTASONE) 10 MG tablet, Take 1 tablet (10 mg total) by mouth daily with breakfast. Take Prednisone by mouth (PO) 30mg  x 4 days (3 pills in morning), then 20mg  x4 days (2 pills), then 10mg  x 4 days (1 pill), then stop, Disp: 24 tablet, Rfl: 0   pseudoephedrine (SUDAFED) 60 MG tablet, Take 1 tablet (60 mg  total) by mouth every 8 (eight) hours as needed for congestion., Disp: 30 tablet, Rfl: 0   tadalafil (CIALIS) 10 MG tablet, Take 1 tablet (10 mg total) by mouth daily as needed for erectile dysfunction., Disp: 10 tablet, Rfl: 3   triamterene-hydrochlorothiazide (DYAZIDE) 37.5-25 MG capsule, TAKE 1 CAPSULE BY MOUTH EVERY DAY, Disp: 90 capsule, Rfl: 2   Physical Exam:   BP (!) 157/82 (BP Location: Left Arm, Patient Position: Sitting, Cuff Size: Normal)   Pulse 84   Resp 19   Wt 194 lb (88 kg)   SpO2 94%   BMI 25.60 kg/m   Salient findings:  CN II-XII intact - clear intermittent twitching of right cheek/midface branches - not forehead or corner of mouth; HB 1/6, no weakness however; tone appears normal; ;no facial masses or skin lesions or scars  Bilateral EAC clear and TM intact with well pneumatized middle ear spaces; no postauricular erythema or swelling Weber 512: midline Rinne 512: AC > BC b/l  Anterior rhinoscopy: Septum relatively midline; bilateral inferior turbinates without significant hypertrophy No lesions of oral cavity/oropharynx No obviously palpable neck masses/lymphadenopathy/thyromegaly No respiratory distress or stridor No extremity weakness, gait normal; A&O x3, affect normal. Appears well  Seprately Identifiable Procedures:  None  Impression & Plans:  Nieves Radziewicz is a 68 y.o. 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 Sensorineural hearing loss, bilateral    It is unclear to me what is causing this - attributes/timeline started after URI CT and MRI (on my read) does not appear to show a structural lesion/mass (see above). He is asymptomatic otherwise. No prior AI disease or Lyme exposure or other infectious exposure. He is generally otherwise healthy and denies significant medical problems.   Review of literature suggests several causes including infectious, vascular loop at exit of CN 7, epilepsy, other central causes.  We will get a CT  temporal bone to rule out any fallopian canal lesions (unlikely given how CT and MRI has looked) Can discontinue flonase Will refer to neuro D/w pt, will call with CT results, f/u PRN given no structural cause noted today; may need botox if all else fails, which I am happy to do       See below regarding exact medications prescribed this encounter including dosages and route: No orders of the defined types were placed in this encounter.     Thank you for allowing me the opportunity to care for your patient. Please do not hesitate to contact me should you have any other questions.  Sincerely, Jovita Kussmaul, MD Otolarynoglogist (ENT), [facility] Phone: [phone] Fax: [phone]  08/25/2023, 8:28 AM   MDM:  Level 4 - 99214 Complexity/Problems addressed: persistent new problem - unknown prognosis; acute Data complexity: mod - independent interpretation of CT and MRI; ordering CT - Morbidity: mod  - Prescription Drug prescribed or managed: yes - d/c flonase

## 2023-08-25 NOTE — Progress Notes (Signed)
Assessment/Plan:   Hemifacial spasm, right  -Explained to the patient that most cases of hemifacial spasm are idiopathic in nature.  We discussed nature and pathophysiology.  We discussed that the only treatment that really seems to be valuable is botulinum toxin.  We talked about the risk, benefits, side effects, including but not limited to the black box warning.  We talked about facial asymmetry (both with treatment, as well as without treatment if it goes on prolonged).  We discussed bruising.  We discussed neutralizing antibodies.  For now, he decided to hold off on treatment.  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  -We did discuss his MRI.  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 Subjective:   Shawn Fitzpatrick was seen today in neurologic consultation at the request of Read Drivers, MD.  The consultation is for the evaluation of hemifacial spasm on the right.  Notes available to me are reviewed.  Patient began to experience some vertigo and facial twitching in October, 2024.  He went to urgent care because of symptoms in October and followed up with: Family practice at the end of October.  Vertigo resolved quickly, but reported that the right facial twitching persisted.  He subsequently was referred to ENT.  He saw Dr. Allena Katz December 4, who reported that the patient really did not have vertigo at onset, but rather some lightheadedness and bilateral facial pressure, but that had resolved.  He was given steroid treatment.  He followed up January 29.  He continued to have symptoms.  He had MRI brain and it showed L AICA in close proximity to L facial nerve.  He notes it doesn't really affect the eye but only the R cheek.  No sensory issues.  No weakness.      ALLERGIES:  No Known Allergies  CURRENT MEDICATIONS:  Outpatient Encounter Medications as of 08/30/2023   Medication Sig   atorvastatin (LIPITOR) 10 MG tablet TAKE 1 TABLET BY MOUTH EVERY DAY   buPROPion (WELLBUTRIN XL) 150 MG 24 hr tablet TAKE 1 TABLET BY MOUTH EVERY DAY   cetirizine (ZYRTEC ALLERGY) 10 MG tablet Take 1 tablet (10 mg total) by mouth daily.   cyclobenzaprine (FLEXERIL) 10 MG tablet Take 1 tablet (10 mg total) by mouth 3 (three) times daily as needed for muscle spasms.   fluticasone (FLONASE) 50 MCG/ACT nasal spray Place 2 sprays into both nostrils daily.   meloxicam (MOBIC) 15 MG tablet TAKE 1 TABLET (15 MG TOTAL) BY MOUTH DAILY.   omeprazole (PRILOSEC) 20 MG capsule TAKE 1 CAPSULE BY MOUTH EVERY DAY   pseudoephedrine (SUDAFED) 60 MG tablet Take 1 tablet (60 mg total) by mouth every 8 (eight) hours as needed for congestion.   tadalafil (CIALIS) 10 MG tablet Take 1 tablet (10 mg total) by mouth daily as needed for erectile dysfunction.   triamterene-hydrochlorothiazide (DYAZIDE) 37.5-25 MG capsule TAKE 1 CAPSULE BY MOUTH EVERY DAY   [DISCONTINUED] predniSONE (DELTASONE) 10 MG tablet Take 1 tablet (10 mg total) by mouth daily with breakfast. Take Prednisone by mouth (PO) 30mg  x 4 days (3 pills in morning), then 20mg  x4 days (2 pills), then 10mg  x 4 days (1 pill), then stop   No facility-administered encounter medications on file as of 08/30/2023.    Objective:   PHYSICAL EXAMINATION:    VITALS:   Vitals:  08/30/23 0904  BP: 128/88  Pulse: 72  SpO2: 98%  Weight: 195 lb (88.5 kg)  Height: 6\' 1"  (1.854 m)    GEN:  Normal appears male in no acute distress.  Appears stated age. HEENT:  Normocephalic, atraumatic. The mucous membranes are moist. The superficial temporal arteries are without ropiness or tenderness. Cardiovascular: Regular rate and rhythm. Lungs: Clear to auscultation bilaterally. Neck/Heme: There are no carotid bruits noted bilaterally.  NEUROLOGICAL: Orientation:  The patient is alert and oriented x 3.   Cranial nerves: There is slight decreased nasolabial  fold on the right at resting position, but he is able to easily activate this when he smiles.  Extraocular muscles are intact and visual fields are full to confrontational testing. Speech is fluent and clear. Soft palate rises symmetrically and there is no tongue deviation. Hearing is intact to conversational tone. Tone: Tone is good throughout. Sensation: Sensation is intact to light touch throughout (facial, trunk, extremities). Vibration is intact at the bilateral big toe. There is no extinction with double simultaneous stimulation. There is no sensory dermatomal level identified. Coordination:  The patient has no difficulty with RAM's or FNF bilaterally. Motor: Strength is 5/5 in the bilateral upper and lower extremities.  Shoulder shrug is equal and symmetric. There is no pronator drift.  There are no fasciculations noted. DTR's: Deep tendon reflexes are 2/4 at the bilateral biceps, triceps, brachioradialis, patella and achilles.  Plantar responses are downgoing bilaterally. Gait and Station: The patient is able to ambulate without difficulty.  Abnormal movements: There is right hemifacial spasm across the right nasalis, right zygomaticus and very mild across the right inferior orbicularis oris.  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  Total time spent on today's visit was 45 minutes, including both face-to-face time and nonface-to-face time.  Time included that spent on review of records (prior notes available to me/labs/imaging if pertinent), discussing treatment and goals, answering patient's questions and coordinating care.    Cc:  Nestor Ramp, MD

## 2023-08-25 NOTE — Patient Instructions (Signed)
I have ordered an imaging study for you to complete prior to your next visit. Please call [facility] Scheduling at [phone] to schedule your imaging if you have not received a call within 24 hours. If you are unable to complete your imaging study prior to your next scheduled visit please call our office to let us know.

## 2023-08-30 ENCOUNTER — Encounter: Payer: Self-pay | Admitting: Neurology

## 2023-08-30 ENCOUNTER — Ambulatory Visit: Payer: Medicare PPO | Admitting: Neurology

## 2023-08-30 VITALS — BP 128/88 | HR 72 | Ht 73.0 in | Wt 195.0 lb

## 2023-08-30 DIAGNOSIS — G245 Blepharospasm: Secondary | ICD-10-CM | POA: Diagnosis not present

## 2023-08-30 DIAGNOSIS — G5131 Clonic hemifacial spasm, right: Secondary | ICD-10-CM | POA: Diagnosis not present

## 2023-09-13 ENCOUNTER — Ambulatory Visit: Payer: Medicare PPO | Admitting: Student

## 2023-09-13 VITALS — BP 123/80 | HR 87 | Temp 98.1°F | Ht 73.0 in | Wt 198.6 lb

## 2023-09-13 DIAGNOSIS — K2970 Gastritis, unspecified, without bleeding: Secondary | ICD-10-CM | POA: Diagnosis not present

## 2023-09-13 DIAGNOSIS — K219 Gastro-esophageal reflux disease without esophagitis: Secondary | ICD-10-CM | POA: Diagnosis not present

## 2023-09-13 MED ORDER — OMEPRAZOLE 20 MG PO CPDR
DELAYED_RELEASE_CAPSULE | ORAL | 1 refills | Status: DC
Start: 2023-09-13 — End: 2023-10-20

## 2023-09-13 NOTE — Progress Notes (Signed)
    SUBJECTIVE:   CHIEF COMPLAINT / HPI:   Shawn Fitzpatrick is a 68 year-old male here with acid reflux/belching.  Having lots of belching with eating, no matter what he eats. Sometimes feels like he has to burp and he cannot get it up. Had cereal this morning, and noticed a lot of burping afterwards.  Had colonoscopy last May 2024 (internal hemorrhoids seen), next due May 2029.  Seen in 2021 with esophageal pain at [facility], same description of current symptoms. Thought to potentially have esophageal spasm, referred to GI and had increase in PPI. He saw GI at [facility] and they did not perform endoscopy at that time. Had colonoscopy last May 2024. Symptoms went away, but have now returned.  Not currently taking any PPI/ OTC acid reflux meds. Not taking any NSAIDS  No abdominal pain. Normal bowel movements. No issues with swallowing or eating.  PERTINENT  PMH / PSH:  Hypertension- well controlled Hemifacial spasm of R side face Hyperlipidemia  OBJECTIVE:   BP 123/80   Pulse 87   Temp 98.1 F (36.7 C)   Ht 6\' 1"  (1.854 m)   Wt 198 lb 9.6 oz (90.1 kg)   SpO2 98%   BMI 26.20 kg/m   General: NAD, well appearing, well-nourished Cardiac: RRR Neuro: A&O. Speech clear and fluent Abdomen: Soft, NTND. No tenderness to epigastric palpation. No palpable masses. Normal, active bowel sounds. Respiratory: normal WOB on RA. No wheezing or crackles on auscultation, good lung sounds throughout Extremities: Moving all 4 extremities equally    ASSESSMENT/PLAN:   Gastroesophageal reflux Symptoms most fitting with gastroesophageal reflux No red flag signs or symptoms such as weight loss, dysphagia to solids or liquids, hematemesis, persistent cough, abdominal pain. Established with GI, encouraged to schedule office visit as they may consider endoscopy Start PPI, omeprazole 20 mg daily for 8 to 12 weeks Has follow-up with PCP established in 3 weeks     Darral Dash, DO [facility]  [facility]

## 2023-09-13 NOTE — Patient Instructions (Addendum)
It was great seeing you today.  As we discussed, - Start taking Omeprazole 20 mg daily for 8-12 weeks. - If any changes in symptoms (difficulty swallowing foods or liquids, abdominal pain that feels severe, nausea/vomiting) - Let your GI doctor know what is going on as well, they may consider further testing   If you have any questions or concerns, please feel free to call the clinic.   Have a wonderful day,  Dr. Darral Dash [facility] [phone]

## 2023-09-16 DIAGNOSIS — K219 Gastro-esophageal reflux disease without esophagitis: Secondary | ICD-10-CM | POA: Insufficient documentation

## 2023-09-16 NOTE — Assessment & Plan Note (Signed)
Symptoms most fitting with gastroesophageal reflux No red flag signs or symptoms such as weight loss, dysphagia to solids or liquids, hematemesis, persistent cough, abdominal pain. Established with GI, encouraged to schedule office visit as they may consider endoscopy Start PPI, omeprazole 20 mg daily for 8 to 12 weeks Has follow-up with PCP established in 3 weeks

## 2023-10-06 ENCOUNTER — Encounter: Payer: Medicare PPO | Admitting: Family Medicine

## 2023-10-20 ENCOUNTER — Ambulatory Visit (INDEPENDENT_AMBULATORY_CARE_PROVIDER_SITE_OTHER): Payer: Medicare PPO | Admitting: Family Medicine

## 2023-10-20 VITALS — BP 116/76 | HR 73 | Ht 73.0 in | Wt 193.6 lb

## 2023-10-20 DIAGNOSIS — Z0000 Encounter for general adult medical examination without abnormal findings: Secondary | ICD-10-CM | POA: Diagnosis not present

## 2023-10-20 DIAGNOSIS — K219 Gastro-esophageal reflux disease without esophagitis: Secondary | ICD-10-CM

## 2023-10-20 DIAGNOSIS — G5131 Clonic hemifacial spasm, right: Secondary | ICD-10-CM

## 2023-10-20 DIAGNOSIS — K2970 Gastritis, unspecified, without bleeding: Secondary | ICD-10-CM | POA: Diagnosis not present

## 2023-10-20 DIAGNOSIS — Z658 Other specified problems related to psychosocial circumstances: Secondary | ICD-10-CM

## 2023-10-20 MED ORDER — ATORVASTATIN CALCIUM 10 MG PO TABS: 10.0000 mg | ORAL_TABLET | Freq: Every day | ORAL | 2 refills | Status: DC

## 2023-10-20 MED ORDER — BUPROPION HCL ER (XL) 150 MG PO TB24: 150.0000 mg | ORAL_TABLET | Freq: Every day | ORAL | 2 refills | Status: AC

## 2023-10-20 MED ORDER — OMEPRAZOLE 20 MG PO CPDR: DELAYED_RELEASE_CAPSULE | ORAL | 1 refills | Status: DC

## 2023-10-20 MED ORDER — CYCLOBENZAPRINE HCL 10 MG PO TABS: 10.0000 mg | ORAL_TABLET | Freq: Three times a day (TID) | ORAL | 0 refills | Status: AC | PRN

## 2023-10-20 MED ORDER — TADALAFIL 10 MG PO TABS: 10.0000 mg | ORAL_TABLET | Freq: Every day | ORAL | 3 refills | Status: DC | PRN

## 2023-10-20 MED ORDER — TRIAMTERENE-HCTZ 37.5-25 MG PO CAPS: 1.0000 | ORAL_CAPSULE | Freq: Every day | ORAL | 3 refills | Status: AC

## 2023-10-20 NOTE — Patient Instructions (Signed)
 Great to see you!

## 2023-10-21 ENCOUNTER — Encounter: Payer: Self-pay | Admitting: Family Medicine

## 2023-10-21 LAB — COMPREHENSIVE METABOLIC PANEL WITH GFR
ALT: 21 IU/L (ref 0–44)
AST: 16 IU/L (ref 0–40)
Albumin: 4.4 g/dL (ref 3.9–4.9)
Alkaline Phosphatase: 108 IU/L (ref 44–121)
BUN/Creatinine Ratio: 15 (ref 10–24)
BUN: 14 mg/dL (ref 8–27)
Bilirubin Total: 0.6 mg/dL (ref 0.0–1.2)
CO2: 26 mmol/L (ref 20–29)
Calcium: 9.5 mg/dL (ref 8.6–10.2)
Chloride: 99 mmol/L (ref 96–106)
Creatinine, Ser: 0.94 mg/dL (ref 0.76–1.27)
Globulin, Total: 2.2 g/dL (ref 1.5–4.5)
Glucose: 82 mg/dL (ref 70–99)
Potassium: 4.6 mmol/L (ref 3.5–5.2)
Sodium: 139 mmol/L (ref 134–144)
Total Protein: 6.6 g/dL (ref 6.0–8.5)
eGFR: 89 mL/min/{1.73_m2} (ref >59)

## 2023-10-21 LAB — LIPID PANEL
Chol/HDL Ratio: 4.2 ratio (ref 0.0–5.0)
Cholesterol, Total: 160 mg/dL (ref 100–199)
HDL: 38 mg/dL — ABNORMAL LOW (ref >39)
LDL Chol Calc (NIH): 100 mg/dL — ABNORMAL HIGH (ref 0–99)
Triglycerides: 120 mg/dL (ref 0–149)
VLDL Cholesterol Cal: 22 mg/dL (ref 5–40)

## 2023-10-21 LAB — PSA: Prostate Specific Ag, Serum: 0.3 ng/mL (ref 0.0–4.0)

## 2023-10-21 NOTE — Assessment & Plan Note (Signed)
 His mom died last year and has been quite a journey for him to process all that but is doing much better.

## 2023-10-21 NOTE — Assessment & Plan Note (Signed)
 He is following with neurology but is decided he does not really want to do any Botox injections at this point.  Says its only intermittently that get spasms and not really bothering him that much.  He will plan to follow-up with neurology however

## 2023-10-21 NOTE — Assessment & Plan Note (Signed)
 Doing very well.  Reviewed health maintenance.  He is increased his activity and is now bowling and enjoying that.  Will check labs including PSA.

## 2023-10-21 NOTE — Assessment & Plan Note (Signed)
Refilled his meds

## 2023-10-21 NOTE — Progress Notes (Signed)
    CHIEF COMPLAINT / HPI: Well adult checkup Feeling great.  Taking his medicines without problem.  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  Bowling 1 or 2 days a week now and really enjoys that as well.   PERTINENT  PMH / PSH: I have reviewed the patient's medications, allergies, past medical and surgical history, smoking status and updated in the EMR as appropriate.   OBJECTIVE:  BP 116/76   Pulse 73   Ht 6\' 1"  (1.854 m)   Wt 193 lb 9.6 oz (87.8 kg)   SpO2 99%   BMI 25.54 kg/m   Vital signs reviewed. GENERAL: Well-developed, well-nourished, no acute distress. CARDIOVASCULAR: Regular rate and rhythm no murmur gallop or rub LUNGS: Clear to auscultation bilaterally, no rales or wheeze. ABDOMEN: Soft positive bowel sounds NEURO: No gross focal neurological deficits. MSK: Movement of extremity x 4. ASSESSMENT / PLAN:   Well adult exam Doing very well.  Reviewed health maintenance.  He is increased his activity and is now bowling and enjoying that.  Will check labs including PSA.  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  Says its only intermittently that get spasms and not really bothering him that much.  He will plan to follow-up with neurology however  Psychosocial stressors His mom died last year and has been quite a journey for him to process all that but is doing much better.  Gastroesophageal reflux Refilled his meds   Denny Levy MD

## 2024-01-06 ENCOUNTER — Ambulatory Visit
Admission: RE | Admit: 2024-01-06 | Discharge: 2024-01-06 | Disposition: A | Discharge status: Home / Self Care | Source: Ambulatory Visit | Attending: Family Medicine | Admitting: Family Medicine

## 2024-01-06 VITALS — BP 104/74 | HR 73 | Temp 97.6°F | Resp 20

## 2024-01-06 DIAGNOSIS — M51369 Other intervertebral disc degeneration, lumbar region without mention of lumbar back pain or lower extremity pain: Secondary | ICD-10-CM

## 2024-01-06 DIAGNOSIS — M545 Low back pain, unspecified: Secondary | ICD-10-CM | POA: Diagnosis not present

## 2024-01-06 MED ORDER — PREDNISONE 20 MG PO TABS: ORAL_TABLET | ORAL | 0 refills | Status: AC

## 2024-01-06 NOTE — ED Triage Notes (Signed)
 Lower back pain started yesterday. Patient denies any injuries. Patient took Flexeril  last night.

## 2024-01-06 NOTE — ED Provider Notes (Signed)
 [facility]  Note:  This document was prepared using Conservation officer, historic buildings and may include unintentional dictation errors.  MRN: [id number] DOB: 1955/09/27  Subjective:   Shawn Fitzpatrick is a 68 y.o. male presenting for 1 day history of acute on chronic low back pain.  No fall, trauma, numbness or tingling, saddle paresthesia, changes to bowel or urinary habits, radicular symptoms.  Has well-documented history of degenerative disc disease of the lumbar region.  Patient used Flexeril  last night with some relief.  He does see an orthopedist.    No current facility-administered medications for this encounter.  Current Outpatient Medications:    atorvastatin  (LIPITOR) 10 MG tablet, Take 1 tablet (10 mg total) by mouth daily., Disp: 90 tablet, Rfl: 2   buPROPion  (WELLBUTRIN  XL) 150 MG 24 hr tablet, Take 1 tablet (150 mg total) by mouth daily., Disp: 90 tablet, Rfl: 2   cetirizine  (ZYRTEC  ALLERGY) 10 MG tablet, Take 1 tablet (10 mg total) by mouth daily., Disp: 30 tablet, Rfl: 0   cyclobenzaprine  (FLEXERIL ) 10 MG tablet, Take 1 tablet (10 mg total) by mouth 3 (three) times daily as needed for muscle spasms., Disp: 30 tablet, Rfl: 0   omeprazole  (PRILOSEC) 20 MG capsule, TAKE 1 CAPSULE BY MOUTH EVERY DAY, Disp: 90 capsule, Rfl: 1   tadalafil  (CIALIS ) 10 MG tablet, Take 1 tablet (10 mg total) by mouth daily as needed for erectile dysfunction., Disp: 10 tablet, Rfl: 3   triamterene -hydrochlorothiazide (DYAZIDE) 37.5-25 MG capsule, Take 1 each (1 capsule total) by mouth daily., Disp: 90 capsule, Rfl: 3   No Known Allergies  Past Medical History:  Diagnosis Date   Hyperlipidemia    Hypertension      Past Surgical History:  Procedure Laterality Date   ACROMIOPLASTY     KNEE SURGERY     TONSILLECTOMY     VASECTOMY      Family History  Problem Relation Age of Onset   Dementia Mother 109   Stroke Father 58   Cancer Father 14       prostate cancer     Social History   Tobacco Use   Smoking status: Never   Smokeless tobacco: Never  Vaping Use   Vaping status: Never Used  Substance Use Topics   Alcohol use: Yes    Comment: rare - 2-3 times/year   Drug use: No    ROS   Objective:   Vitals: BP 104/74 (BP Location: Left Arm)   Pulse 73   Temp 97.6 F (36.4 C) (Oral)   Resp 20   SpO2 97%   Physical Exam Constitutional:      General: He is not in acute distress.    Appearance: Normal appearance. He is well-developed and normal weight. He is not ill-appearing, toxic-appearing or diaphoretic.  HENT:     Head: Normocephalic and atraumatic.     Right Ear: External ear normal.     Left Ear: External ear normal.     Nose: Nose normal.     Mouth/Throat:     Pharynx: Oropharynx is clear.   Eyes:     General: No scleral icterus.       Right eye: No discharge.        Left eye: No discharge.     Extraocular Movements: Extraocular movements intact.    Cardiovascular:     Rate and Rhythm: Normal rate.  Pulmonary:     Effort: Pulmonary effort is normal.   Musculoskeletal:  Cervical back: Normal range of motion.     Lumbar back: Spasms and tenderness present. No swelling, edema, deformity, signs of trauma, lacerations or bony tenderness. Decreased range of motion. Negative right straight leg raise test and negative left straight leg raise test. No scoliosis.   Neurological:     Mental Status: He is alert and oriented to person, place, and time.   Psychiatric:        Mood and Affect: Mood normal.        Behavior: Behavior normal.        Thought Content: Thought content normal.        Judgment: Judgment normal.     Assessment and Plan :   PDMP not reviewed this encounter.  1. Acute bilateral low back pain without sciatica   2. Degeneration of intervertebral disc of lumbar region, unspecified whether pain present    Recommend an oral prednisone  course for acute on chronic low back pain in the context of  degenerative disc disease.  Follow-up with his orthopedist soon as possible.  Counseled patient on potential for adverse effects with medications prescribed/recommended today, ER and return-to-clinic precautions discussed, patient verbalized understanding.    Adolph Hoop, PA-C 01/06/24 1017

## 2024-03-15 ENCOUNTER — Encounter: Payer: Self-pay | Admitting: Neurology

## 2024-03-17 NOTE — Progress Notes (Signed)
   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  Again explained to him that this does not correlate with the correct side of symptoms.  We discussed how Botox works and how we decide which muscles to inject.  We discussed risks and benefits.  He doesn't want to proceed due to mild nature.  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     Subjective:   Shawn Fitzpatrick was seen today in follow up for hemifacial spasm.  I saw him in February.  He opted to hold on Botox.  He emailed me last week and wanted to know if we could just inject a particular nerve to help him.  I told him that that was not something that we could do.  He had other questions and we decided to make him an appointment to follow-up.  He doesn't notice it if driving/concentrating/reading.  He notes it if quiet.  Separately, he feels great physically.  [place]     CURRENT MEDICATIONS:  Outpatient Encounter Medications as of 03/20/2024  Medication Sig   atorvastatin  (LIPITOR) 10 MG tablet Take 1 tablet (10 mg total) by mouth daily.   buPROPion  (WELLBUTRIN  XL) 150 MG 24 hr tablet Take 1 tablet (150 mg total) by mouth daily.   cetirizine  (ZYRTEC  ALLERGY) 10 MG tablet Take 1 tablet (10 mg total) by mouth daily.   cyclobenzaprine  (FLEXERIL ) 10 MG tablet Take 1 tablet (10 mg total) by mouth 3 (three) times daily as needed for muscle spasms.   omeprazole  (PRILOSEC) 20 MG capsule TAKE 1 CAPSULE BY MOUTH EVERY DAY   predniSONE  (DELTASONE ) 20 MG tablet Take 2 tablets daily with breakfast.   tadalafil  (CIALIS ) 10 MG tablet Take 1 tablet (10 mg total) by mouth daily as needed for erectile dysfunction.   triamterene -hydrochlorothiazide (DYAZIDE) 37.5-25 MG capsule Take 1 each (1 capsule total) by mouth daily.   No  facility-administered encounter medications on file as of 03/20/2024.     Objective:   PHYSICAL EXAMINATION:    VITALS:   Vitals:   03/20/24 0838  BP: 122/82  Pulse: 80  SpO2: 98%  Weight: 193 lb (87.5 kg)  Height: 6' 1 (1.854 m)    GEN:  The patient appears stated age and is in NAD. HEENT:  Normocephalic, atraumatic.  The mucous membranes are moist. The superficial temporal arteries are without ropiness or tenderness.   Neurological examination:  Orientation: The patient is alert and oriented x3. Cranial nerves: There is good facial symmetry.The speech is fluent and clear. Soft palate rises symmetrically and there is no tongue deviation. Hearing is intact to conversational tone. Sensation: Sensation is intact to light touch throughout Motor: Strength is at least antigravity x4.  Movement examination: Tone: There is normal tone in the UE/LE Abnormal movements:  There is right hemifacial spasm across the right nasalis, right zygomaticus and very mild across the right inferior orbicularis oris.  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  This is stable c/t previous Gait and Station: The patient has no difficulty arising out of a deep-seated chair without the use of the hands. The patient's stride length is good.      Cc:  Rosalynn Camie CROME, MD

## 2024-03-20 ENCOUNTER — Encounter: Payer: Self-pay | Admitting: Neurology

## 2024-03-20 ENCOUNTER — Ambulatory Visit: Admitting: Neurology

## 2024-03-20 VITALS — BP 122/82 | HR 80 | Ht 73.0 in | Wt 193.0 lb

## 2024-03-20 DIAGNOSIS — G5131 Clonic hemifacial spasm, right: Secondary | ICD-10-CM | POA: Diagnosis not present

## 2024-03-20 DIAGNOSIS — G245 Blepharospasm: Secondary | ICD-10-CM | POA: Diagnosis not present

## 2024-03-26 ENCOUNTER — Other Ambulatory Visit: Payer: Self-pay | Admitting: Family Medicine

## 2024-03-26 DIAGNOSIS — K2970 Gastritis, unspecified, without bleeding: Secondary | ICD-10-CM

## 2024-04-18 ENCOUNTER — Ambulatory Visit: Admitting: Student

## 2024-04-18 VITALS — BP 130/70 | HR 89 | Temp 99.7°F | Wt 193.4 lb

## 2024-04-18 DIAGNOSIS — J069 Acute upper respiratory infection, unspecified: Secondary | ICD-10-CM

## 2024-04-18 NOTE — Progress Notes (Signed)
    SUBJECTIVE:   CHIEF COMPLAINT / HPI:   68 year old male with history of HTN and GERD Presented today for 1 day history of chills and low-grade fever Reported other associated symptoms include body ache and sneezing. He has been taking Tylenol  which has helped reduce his fever. No known sick contacts Test for flu and COVID at [facility] today were negative He has had decreased appetite but drinking adequately Denies any chest pain, vomiting or diarrhea.  PERTINENT  PMH / PSH: Reviewed   OBJECTIVE:   BP 130/70   Pulse 89   Temp 99.7 F (37.6 C)   Wt 193 lb 6.4 oz (87.7 kg)   SpO2 100%   BMI 25.52 kg/m    Physical Exam General: Alert, well appearing, NAD HEENT: Mild tonsillar exudate or cervical lymphadenopathy Cardiovascular: RRR, No Murmurs, Normal S2/S2 Respiratory: CTAB, No wheezing or Rales Abdomen: No distension or tenderness  ASSESSMENT/PLAN:   Viral illness 68 y.o. year old presents with fever, cough, congestion, rhinorrhea and sore throat. He is afebrile today and on exam has oropharyngeal erythema,  good work of breathing on RA and clear breath sounds bilaterally. Overall presentation and exam is consistent with viral URI.  Recent COVID and flu test at [facility] earlier today were negative. - Discussed conservative management with warm water, honey and lemon. - Recommended tylenol  or ibuprofen for fever.  - Encouraged adequate hydration for patient. - Provided patient with work note. - Outline signs and symptoms that will warrant ED visit or return for further assessment.    Norleen April, MD [facility]

## 2024-04-18 NOTE — Patient Instructions (Signed)
 Pleasure to meet you today.  Suspect your symptoms are most likely due to ongoing viral upper respiratory illness   I recommend making sure you stay hydrated with enough water.  Also you can do Tylenol or ibuprofen for any aches and pain with the cough.  Encourage use of warm water, honey and lemon to help with the cough.

## 2024-04-19 ENCOUNTER — Encounter: Payer: Self-pay | Admitting: Student

## 2024-04-21 ENCOUNTER — Ambulatory Visit: Admitting: Family Medicine

## 2024-04-21 NOTE — Progress Notes (Deleted)
    SUBJECTIVE:   CHIEF COMPLAINT / HPI: not feeling better  Seen by Dr. Rosendo 09/23 with viral URI  PERTINENT  PMH / PSH: ***  OBJECTIVE:   There were no vitals taken for this visit.  ***  ASSESSMENT/PLAN:   Assessment & Plan  No follow-ups on file.  Shawn Provencal, MD [facility]

## 2024-04-26 DIAGNOSIS — I129 Hypertensive chronic kidney disease with stage 1 through stage 4 chronic kidney disease, or unspecified chronic kidney disease: Secondary | ICD-10-CM | POA: Diagnosis not present

## 2024-04-26 DIAGNOSIS — F325 Major depressive disorder, single episode, in full remission: Secondary | ICD-10-CM | POA: Diagnosis not present

## 2024-04-26 DIAGNOSIS — M545 Low back pain, unspecified: Secondary | ICD-10-CM | POA: Diagnosis not present

## 2024-04-26 DIAGNOSIS — N182 Chronic kidney disease, stage 2 (mild): Secondary | ICD-10-CM | POA: Diagnosis not present

## 2024-04-26 DIAGNOSIS — Z833 Family history of diabetes mellitus: Secondary | ICD-10-CM | POA: Diagnosis not present

## 2024-04-26 DIAGNOSIS — Z8249 Family history of ischemic heart disease and other diseases of the circulatory system: Secondary | ICD-10-CM | POA: Diagnosis not present

## 2024-04-26 DIAGNOSIS — E785 Hyperlipidemia, unspecified: Secondary | ICD-10-CM | POA: Diagnosis not present

## 2024-04-26 DIAGNOSIS — M62838 Other muscle spasm: Secondary | ICD-10-CM | POA: Diagnosis not present

## 2024-04-26 DIAGNOSIS — K219 Gastro-esophageal reflux disease without esophagitis: Secondary | ICD-10-CM | POA: Diagnosis not present

## 2024-05-08 ENCOUNTER — Encounter: Payer: Self-pay | Admitting: Family Medicine

## 2024-05-17 ENCOUNTER — Other Ambulatory Visit: Payer: Self-pay | Admitting: Family Medicine

## 2024-05-23 ENCOUNTER — Encounter: Payer: Self-pay | Admitting: Neurology

## 2024-05-29 ENCOUNTER — Encounter

## 2024-05-29 ENCOUNTER — Ambulatory Visit: Payer: Medicare PPO | Admitting: Neurology

## 2024-07-25 ENCOUNTER — Encounter: Payer: Self-pay | Admitting: Neurology

## 2024-07-25 NOTE — Telephone Encounter (Signed)
 Would yo like me to get this patient scheduled. He CX his appointment on 05/29/24 .

## 2024-07-28 ENCOUNTER — Ambulatory Visit
Admission: RE | Admit: 2024-07-28 | Discharge: 2024-07-28 | Disposition: A | Discharge status: Home / Self Care | Source: Ambulatory Visit | Attending: Family Medicine

## 2024-07-28 VITALS — BP 110/72 | HR 100 | Temp 98.6°F | Resp 18

## 2024-07-28 DIAGNOSIS — B9789 Other viral agents as the cause of diseases classified elsewhere: Secondary | ICD-10-CM | POA: Diagnosis not present

## 2024-07-28 DIAGNOSIS — J988 Other specified respiratory disorders: Secondary | ICD-10-CM | POA: Diagnosis not present

## 2024-07-28 DIAGNOSIS — R52 Pain, unspecified: Secondary | ICD-10-CM | POA: Diagnosis not present

## 2024-07-28 DIAGNOSIS — R072 Precordial pain: Secondary | ICD-10-CM | POA: Insufficient documentation

## 2024-07-28 LAB — POCT INFLUENZA A/B
Influenza A, POC: NEGATIVE
Influenza B, POC: NEGATIVE

## 2024-07-28 MED ORDER — PSEUDOEPHEDRINE HCL 30 MG PO TABS: 30.0000 mg | ORAL_TABLET | Freq: Three times a day (TID) | ORAL | 0 refills | Status: AC | PRN

## 2024-07-28 MED ORDER — IBUPROFEN 600 MG PO TABS: 600.0000 mg | ORAL_TABLET | Freq: Four times a day (QID) | ORAL | 0 refills | Status: AC | PRN

## 2024-07-28 MED ORDER — CETIRIZINE HCL 10 MG PO TABS: 10.0000 mg | ORAL_TABLET | Freq: Every day | ORAL | 0 refills | Status: AC

## 2024-07-28 MED ORDER — PROMETHAZINE-DM 6.25-15 MG/5ML PO SYRP: 5.0000 mL | ORAL_SOLUTION | Freq: Three times a day (TID) | ORAL | 0 refills | Status: AC | PRN

## 2024-07-28 NOTE — ED Triage Notes (Signed)
 Patient presents to UC for cough, body aches, chills, and chest congestion x weds. Treating with OTC alka-seltzer with no relief.   Denies fever.

## 2024-07-28 NOTE — ED Provider Notes (Signed)
 " [facility] [employer]  Note:  This document was prepared using Conservation officer, historic buildings and may include unintentional dictation errors.  MRN: [id number] DOB: [birth year]  Subjective:   Shawn Fitzpatrick is a 69 y.o. male presenting for 2-day history of bodyaches, malaise, fatigue, chills, nasal congestion, productive cough.  Has been using over-the-counter medications. No fever, chest pain, shob, wheezing. No asthma. No smoking of any kind including cigarettes, cigars, vaping, marijuana use.    Current Outpatient Medications  Medication Instructions   atorvastatin  (LIPITOR) 10 mg, Oral, Daily   buPROPion  (WELLBUTRIN  XL) 150 mg, Oral, Daily   cetirizine  (ZYRTEC  ALLERGY) 10 mg, Oral, Daily   cyclobenzaprine  (FLEXERIL ) 10 mg, Oral, 3 times daily PRN   omeprazole  (PRILOSEC) 20 MG capsule Oral, Daily   predniSONE  (DELTASONE ) 20 MG tablet Take 2 tablets daily with breakfast.   tadalafil  (CIALIS ) 10 MG tablet TAKE 1 TABLET BY MOUTH DAILY AS NEEDED FOR ERECTILE DYSFUNCTION   triamterene -hydrochlorothiazide (DYAZIDE) 37.5-25 MG capsule 1 capsule, Oral, Daily    Allergies[1]  Past Medical History:  Diagnosis Date   Hyperlipidemia    Hypertension      Past Surgical History:  Procedure Laterality Date   ACROMIOPLASTY     KNEE SURGERY     TONSILLECTOMY     VASECTOMY      Family History  Problem Relation Age of Onset   Dementia Mother 60   Stroke Father 30   Cancer Father 12       prostate cancer    Social History   Occupational History   Occupation: retired    Comment: publishing copy  Tobacco Use   Smoking status: Never   Smokeless tobacco: Never  Vaping Use   Vaping status: Never Used  Substance and Sexual Activity   Alcohol use: Yes    Comment: rare - 2-3 times/year   Drug use: No   Sexual activity: Yes    Comment: vasectomy     ROS   Objective:   Vitals: BP 110/72 (BP Location: Right Arm)   Pulse 100   Temp 98.6 F (37 C) (Oral)    Resp 18   SpO2 95%   Physical Exam Constitutional:      General: He is not in acute distress.    Appearance: Normal appearance. He is well-developed and normal weight. He is not ill-appearing, toxic-appearing or diaphoretic.  HENT:     Head: Normocephalic and atraumatic.     Right Ear: Tympanic membrane, ear canal and external ear normal. No drainage, swelling or tenderness. No middle ear effusion. There is no impacted cerumen. Tympanic membrane is not erythematous or bulging.     Left Ear: Tympanic membrane, ear canal and external ear normal. No drainage, swelling or tenderness.  No middle ear effusion. There is no impacted cerumen. Tympanic membrane is not erythematous or bulging.     Nose: Nose normal. No congestion or rhinorrhea.     Mouth/Throat:     Mouth: Mucous membranes are moist.     Pharynx: No oropharyngeal exudate or posterior oropharyngeal erythema.  Eyes:     General: No scleral icterus.       Right eye: No discharge.        Left eye: No discharge.     Extraocular Movements: Extraocular movements intact.     Conjunctiva/sclera: Conjunctivae normal.  Cardiovascular:     Rate and Rhythm: Normal rate and regular rhythm.     Heart sounds: Normal heart sounds. No murmur  heard.    No friction rub. No gallop.  Pulmonary:     Effort: Pulmonary effort is normal. No respiratory distress.     Breath sounds: Normal breath sounds. No stridor. No wheezing, rhonchi or rales.  Musculoskeletal:     Cervical back: Normal range of motion and neck supple. No rigidity. No muscular tenderness.  Neurological:     General: No focal deficit present.     Mental Status: He is alert and oriented to person, place, and time.  Psychiatric:        Mood and Affect: Mood normal.        Behavior: Behavior normal.        Thought Content: Thought content normal.    Rapid flu negative for A, B.  Assessment and Plan :   PDMP not reviewed this encounter.  1. Viral respiratory infection   2.  Body aches      Deferred imaging given clear cardiopulmonary exam, hemodynamically stable vital signs. Suspect viral URI, viral syndrome. Physical exam findings reassuring and vital signs stable for discharge. Advised supportive care, offered symptomatic relief. Counseled patient on potential for adverse effects with medications prescribed/recommended today, ER and return-to-clinic precautions discussed, patient verbalized understanding.       [1] No Known Allergies    Christopher Savannah, [place] 07/28/24 9088  "

## 2024-07-28 NOTE — Discharge Instructions (Addendum)
 We will manage this as a viral respiratory infection. For sore throat or cough try using a honey-based tea. Use 3 teaspoons of honey with juice squeezed from half lemon. Place shaved pieces of ginger into 1/2-1 cup of water and warm over stove top. Then mix the ingredients and repeat every 4 hours as needed. Please take Tylenol 500mg -650mg  once every 6 hours for fevers, aches and pains. Hydrate very well with at least 2 liters (64 ounces) of water. Eat light meals such as soups (chicken and noodles, chicken wild rice, vegetable).  Do not eat any foods that you are allergic to.  Start an antihistamine like Zyrtec (10mg  daily) for postnasal drainage, sinus congestion.  You can take this together with pseudoephedrine (Sudafed) at a dose of 30 mg 3 times a day or twice daily as needed for the same kind of congestion.  Use cough syrup as needed.

## 2024-08-01 ENCOUNTER — Ambulatory Visit (INDEPENDENT_AMBULATORY_CARE_PROVIDER_SITE_OTHER): Admitting: Student

## 2024-08-01 VITALS — BP 120/76 | HR 101 | Wt 199.2 lb

## 2024-08-01 DIAGNOSIS — H8111 Benign paroxysmal vertigo, right ear: Secondary | ICD-10-CM | POA: Diagnosis not present

## 2024-08-01 MED ORDER — MECLIZINE HCL 12.5 MG PO TABS: 12.5000 mg | ORAL_TABLET | Freq: Three times a day (TID) | ORAL | 0 refills | Status: AC | PRN

## 2024-08-01 NOTE — Progress Notes (Signed)
" ° ° °  SUBJECTIVE:   CHIEF COMPLAINT / HPI:   Timing: Started on Sunday (2 days ago), prosodic triggered Duration: Lasts seconds to minutes Triggers: Standing, fast movements, driving Medications: Review of medications, is on blood pressure medication Substances: No substance use Associated sxs: Viral URI  OBJECTIVE:   BP 120/76   Pulse (!) 101   Wt 199 lb 3.2 oz (90.4 kg)   SpO2 97%   BMI 26.28 kg/m    General: NAD, pleasant HEENT: Normocephalic, atraumatic head. Normal external ear, canal, TM bilaterally. EOM intact and normal conjunctiva BL. Normal external nose. Throat not erythematous, no exudate, no deviation. Cardio: RRR, no MRG. Respiratory: CTAB, normal wob on RA Skin: Warm and dry Dix-Hallpike: Horizontal nystagmus present with symptoms, right localizing. Sxs subsided after 30 seconds of holding position. Epley maneuver performed, with minimal improvement. Orthostatics: Negative  ASSESSMENT/PLAN:   Assessment & Plan Benign paroxysmal positional vertigo of right ear - Symptoms and exam consistent with episodic triggered dizziness - Trial of Epley, minimal improvement - Will prescribe 2 day med course for as needed - Using shared decision making, patient prefers to hold off on vestibular rehab at this time given this typically occurs during the winter times when he is ill and usually it subsides - Work note provided, recommend against driving until symptoms are resolved - ED and return precautions discussed   Gladis Church, DO [facility] "

## 2024-08-01 NOTE — Patient Instructions (Signed)
 It was great to see you! Thank you for allowing me to participate in your care!   I recommend that you always bring your medications to each appointment as this makes it easy to ensure we are on the correct medications and helps us  not miss when refills are needed.  Our plans for today:  - Take meclizine  only as needed if dizziness does not subside, or occurs more frequently.  Do not drive after taking meclizine . - Follow-up if symptoms are not improving the next 1 to 2 weeks - If you develop difficulty speaking, facial droop, difficulty walking, weakness of your arms or legs please go to the emergency room   Take care and seek immediate care sooner if you develop any concerns. Please remember to show up 15 minutes before your scheduled appointment time!  Gladis Church, DO [facility]

## 2024-08-21 ENCOUNTER — Encounter

## 2024-08-24 ENCOUNTER — Ambulatory Visit
Admission: RE | Admit: 2024-08-24 | Discharge: 2024-08-24 | Disposition: A | Discharge status: Home / Self Care | Attending: Physician Assistant | Admitting: Physician Assistant

## 2024-08-24 ENCOUNTER — Other Ambulatory Visit: Payer: Self-pay

## 2024-08-24 VITALS — BP 120/80 | HR 84 | Temp 97.4°F | Resp 18

## 2024-08-24 DIAGNOSIS — M545 Low back pain, unspecified: Secondary | ICD-10-CM

## 2024-08-24 MED ORDER — MELOXICAM 15 MG PO TABS: 15.0000 mg | ORAL_TABLET | Freq: Every day | ORAL | 0 refills | Status: AC

## 2024-08-24 NOTE — ED Provider Notes (Signed)
 " [facility]    CSN: [id number] Arrival date & time: 08/24/24  1043      History   Chief Complaint Chief Complaint  Patient presents with   Letter for School/Work   Back Pain    Entered by patient    HPI Shawn Fitzpatrick is a 69 y.o. male.   HPI  Pt is here today for concerns of back injury. He states he was trying to shovel his driveway and had to catch himself from falling which he think caused him to tweak his back. He states this happened yesterday  He reports having lower back pain along both sides.  He denies radiation of pain into extremities or numbness/ tingling.  He has not taken anything for his symptoms but reports that he does have flexeril  at home - he knows this can cause drowsiness and plans to take later tonight if needed. 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 Authorizing Provider  meloxicam  (MOBIC ) 15 MG tablet Take 1 tablet (15 mg total) by mouth daily. 08/24/24  Yes Naphtali Riede E, PA-C  atorvastatin  (LIPITOR) 10 MG tablet Take 1 tablet (10 mg total) by mouth daily. 10/20/23   Rosalynn Camie CROME, MD  buPROPion  (WELLBUTRIN  XL) 150 MG 24 hr tablet Take 1  tablet (150 mg total) by mouth daily. 10/20/23   Rosalynn Camie CROME, MD  cetirizine  (ZYRTEC  ALLERGY) 10 MG tablet Take 1 tablet (10 mg total) by mouth daily. 07/28/24   Christopher Savannah, PA-C  cyclobenzaprine  (FLEXERIL ) 10 MG tablet Take 1 tablet (10 mg total) by mouth 3 (three) times daily as needed for muscle spasms. 10/20/23   Rosalynn Camie CROME, MD  ibuprofen  (ADVIL ) 600 MG tablet Take 1 tablet (600 mg total) by mouth every 6 (six) hours as needed. 07/28/24   Christopher Savannah, PA-C  meclizine  (ANTIVERT ) 12.5 MG tablet Take 1 tablet (12.5 mg total) by mouth 3 (three) times daily as needed for dizziness. 08/01/24   Howell Lunger, DO  omeprazole  (PRILOSEC) 20 MG capsule TAKE 1 CAPSULE BY MOUTH EVERY DAY 03/28/24   Rosalynn Camie CROME, MD  predniSONE  (DELTASONE ) 20 MG tablet Take 2 tablets daily with breakfast. 01/06/24   Christopher Savannah, PA-C  promethazine -dextromethorphan (PROMETHAZINE -DM) 6.25-15 MG/5ML syrup Take 5 mLs by mouth 3 (three) times daily as needed for cough. 07/28/24   Christopher Savannah, PA-C  pseudoephedrine  (SUDAFED) 30 MG tablet Take 1 tablet (30 mg total) by mouth every 8 (eight) hours as needed for congestion. 07/28/24   Christopher Savannah, PA-C  tadalafil  (CIALIS ) 10 MG tablet TAKE 1 TABLET  BY MOUTH DAILY AS NEEDED FOR ERECTILE DYSFUNCTION 05/18/24   Rosalynn Camie CROME, MD  triamterene -hydrochlorothiazide (DYAZIDE) 37.5-25 MG capsule Take 1 each (1 capsule total) by mouth daily. 10/20/23   Rosalynn Camie CROME, MD    Family History Family History  Problem Relation Age of Onset   Dementia Mother 15   Stroke Father 32   Cancer Father 86       prostate cancer    Social History Social History[1]   Allergies   Patient has no known allergies.   Review of Systems Review of Systems  Musculoskeletal:  Positive for back pain.     Physical Exam Triage Vital Signs ED Triage Vitals  Encounter Vitals Group     BP 08/24/24 1111 120/80     Girls Systolic BP Percentile --      Girls Diastolic BP Percentile --      Boys Systolic BP Percentile  --      Boys Diastolic BP Percentile --      Pulse Rate 08/24/24 1111 84     Resp 08/24/24 1111 18     Temp 08/24/24 1111 (!) 97.4 F (36.3 C)     Temp Source 08/24/24 1111 Oral     SpO2 08/24/24 1111 96 %     Weight --      Height --      Head Circumference --      Peak Flow --      Pain Score 08/24/24 1109 6     Pain Loc --      Pain Education --      Exclude from Growth Chart --    No data found.  Updated Vital Signs BP 120/80 (BP Location: Right Arm)   Pulse 84   Temp (!) 97.4 F (36.3 C) (Oral)   Resp 18   SpO2 96%   Visual Acuity Right Eye Distance:   Left Eye Distance:   Bilateral Distance:    Right Eye Near:   Left Eye Near:    Bilateral Near:     Physical Exam Vitals reviewed.  Constitutional:      General: He is awake.     Appearance: Normal appearance. He is well-developed and well-groomed.  HENT:     Head: Normocephalic and atraumatic.  Eyes:     Extraocular Movements: Extraocular movements intact.     Conjunctiva/sclera: Conjunctivae normal.  Pulmonary:     Effort: Pulmonary effort is normal.  Musculoskeletal:     Cervical back: Normal range of motion.     Comments: No obvious step-offs, abnormalities to palpation of cervical, thoracic, lumbar spines.  He does have bilateral lumbar paraspinal muscle tenderness as well as some slight swelling.  ROM is largely intact  Neurological:     Mental Status: He is alert and oriented to person, place, and time.  Psychiatric:        Attention and Perception: Attention normal.        Mood and Affect: Mood normal.        Speech: Speech normal.        Behavior: Behavior normal. Behavior is cooperative.        Thought Content: Thought content normal.        Judgment: Judgment normal.      UC Treatments / Results  Labs (all labs ordered are listed, but only abnormal results are displayed) Labs Reviewed - No data to display  EKG   Radiology No results found.  Procedures Procedures (including  critical  care time)  Medications Ordered in UC Medications - No data to display  Initial Impression / Assessment and Plan / UC Course  I have reviewed the triage vital signs and the nursing notes.  Pertinent labs & imaging results that were available during my care of the patient were reviewed by me and considered in my medical decision making (see chart for details).      Final Clinical Impressions(s) / UC Diagnoses   Final diagnoses:  Acute bilateral low back pain without sciatica   Patient is here today for concerns of lower back pain.  He reports that he was shoveling his driveway yesterday and had to catch himself from falling leading to tweaking his back.  Physical exam is notable for mild tenderness, spasms, slight swelling of the paraspinal muscles in the lumbar spine, bilaterally.  Based on patient's symptoms and presentation I suspect muscular etiology.  Prescription for meloxicam  sent in to assist with symptoms.  Reviewed that he should avoid other NSAID medications while taking this and can use Tylenol  for further relief.  Reviewed home care measures such as warm compresses, gentle stretches, massage as tolerated.  Reviewed that if he does decide to use his Flexeril  that this can cause drowsiness and sedation so he should not use it if he needs to remain alert to drive.  Follow-up as needed    Discharge Instructions      Based on your symptoms and physical exam I believe the following is the cause of your concern today Back pain likely secondary to a strain of your back muscles  I recommend the following at this time to help relieve that discomfort:  Rest Warm compresses to the area (20 minutes on, minimum of 30 minutes off) You can alternate Tylenol  and Ibuprofen  for pain management but Ibuprofen  is typically preferred to reduce inflammation.  I have sent in a script for Meloxicam  for you to take once per day. DO NOT use other NSAIDs while taking this medication  (ibuprofen , motrin , aleve, advil , naproxen, etc) You can take Tylenol  with it   Gentle stretches and exercises that I have included in your paperwork Try to reduce excess strain to the area and rest as much as possible  Wear supportive shoes and, if you must lift anything, use proper lifting techniques that spare your back.   If these measures do not lead to improvement in your symptoms over the next 2-4 weeks please let us  know        ED Prescriptions     Medication Sig Dispense Auth. Provider   meloxicam  (MOBIC ) 15 MG tablet Take 1 tablet (15 mg total) by mouth daily. 30 tablet Stephane Niemann E, PA-C      PDMP not reviewed this encounter.     [1]  Social History Tobacco Use   Smoking status: Never   Smokeless tobacco: Never  Vaping Use   Vaping status: Never Used  Substance Use Topics   Alcohol use: Yes    Comment: rare - 2-3 times/year   Drug use: No     Helio Lack, Rocky BRAVO, PA-C 08/24/24 1452  "

## 2024-08-24 NOTE — ED Triage Notes (Signed)
 Pt presents with a chief complaint of lower posterior back pain. States he was shoveling ice in his driveway. Explains that it was icy and he caught himself from falling. Currently rates overall pain a 6/10. No home interventions taken. Does have Flexeril  at home. Needing a work note. 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 Unable to go in due to his pain.

## 2024-08-24 NOTE — Discharge Instructions (Addendum)
 Based on your symptoms and physical exam I believe the following is the cause of your concern today Back pain likely secondary to a strain of your back muscles  I recommend the following at this time to help relieve that discomfort:  Rest Warm compresses to the area (20 minutes on, minimum of 30 minutes off) You can alternate Tylenol  and Ibuprofen  for pain management but Ibuprofen  is typically preferred to reduce inflammation.  I have sent in a script for Meloxicam  for you to take once per day. DO NOT use other NSAIDs while taking this medication (ibuprofen , motrin , aleve, advil , naproxen, etc) You can take Tylenol  with it   Gentle stretches and exercises that I have included in your paperwork Try to reduce excess strain to the area and rest as much as possible  Wear supportive shoes and, if you must lift anything, use proper lifting techniques that spare your back.   If these measures do not lead to improvement in your symptoms over the next 2-4 weeks please let us  know

## 2024-08-27 ENCOUNTER — Other Ambulatory Visit: Payer: Self-pay | Admitting: Family Medicine

## 2024-10-02 ENCOUNTER — Encounter

## 2024-10-25 ENCOUNTER — Encounter: Admitting: Family Medicine

## 2024-12-04 ENCOUNTER — Ambulatory Visit: Payer: Self-pay | Admitting: Neurology
